# Patient Record
Sex: Female | Born: 1940 | Race: White | Hispanic: No | Marital: Single | Smoking: Former smoker
Health system: Southern US, Community
[De-identification: ages and names within clinical notes are randomized; demographics above are authoritative.]

## PROBLEM LIST (undated history)

## (undated) DIAGNOSIS — K219 Gastro-esophageal reflux disease without esophagitis: Secondary | ICD-10-CM

## (undated) DIAGNOSIS — Z860101 Personal history of adenomatous and serrated colon polyps: Secondary | ICD-10-CM

## (undated) DIAGNOSIS — Z86010 Personal history of colonic polyps: Secondary | ICD-10-CM

## (undated) DIAGNOSIS — G4730 Sleep apnea, unspecified: Secondary | ICD-10-CM

## (undated) DIAGNOSIS — Z8739 Personal history of other diseases of the musculoskeletal system and connective tissue: Secondary | ICD-10-CM

## (undated) DIAGNOSIS — Z923 Personal history of irradiation: Secondary | ICD-10-CM

## (undated) DIAGNOSIS — R040 Epistaxis: Secondary | ICD-10-CM

## (undated) DIAGNOSIS — K5909 Other constipation: Secondary | ICD-10-CM

## (undated) DIAGNOSIS — R011 Cardiac murmur, unspecified: Secondary | ICD-10-CM

## (undated) DIAGNOSIS — E119 Type 2 diabetes mellitus without complications: Secondary | ICD-10-CM

## (undated) DIAGNOSIS — E785 Hyperlipidemia, unspecified: Secondary | ICD-10-CM

## (undated) DIAGNOSIS — C50919 Malignant neoplasm of unspecified site of unspecified female breast: Secondary | ICD-10-CM

## (undated) DIAGNOSIS — I10 Essential (primary) hypertension: Secondary | ICD-10-CM

## (undated) DIAGNOSIS — C541 Malignant neoplasm of endometrium: Secondary | ICD-10-CM

## (undated) DIAGNOSIS — E790 Hyperuricemia without signs of inflammatory arthritis and tophaceous disease: Secondary | ICD-10-CM

## (undated) DIAGNOSIS — Q309 Congenital malformation of nose, unspecified: Secondary | ICD-10-CM

## (undated) DIAGNOSIS — Z87442 Personal history of urinary calculi: Secondary | ICD-10-CM

## (undated) DIAGNOSIS — K5790 Diverticulosis of intestine, part unspecified, without perforation or abscess without bleeding: Secondary | ICD-10-CM

## (undated) DIAGNOSIS — R0600 Dyspnea, unspecified: Secondary | ICD-10-CM

## (undated) DIAGNOSIS — M1990 Unspecified osteoarthritis, unspecified site: Secondary | ICD-10-CM

## (undated) DIAGNOSIS — I4891 Unspecified atrial fibrillation: Secondary | ICD-10-CM

## (undated) DIAGNOSIS — D4819 Other specified neoplasm of uncertain behavior of connective and other soft tissue: Secondary | ICD-10-CM

## (undated) DIAGNOSIS — D481 Neoplasm of uncertain behavior of connective and other soft tissue: Secondary | ICD-10-CM

## (undated) DIAGNOSIS — I499 Cardiac arrhythmia, unspecified: Secondary | ICD-10-CM

## (undated) HISTORY — DX: Unspecified osteoarthritis, unspecified site: M19.90

## (undated) HISTORY — DX: Essential (primary) hypertension: I10

## (undated) HISTORY — PX: TONSILLECTOMY: SUR1361

## (undated) HISTORY — PX: COLONOSCOPY: SHX174

## (undated) HISTORY — PX: OOPHORECTOMY: SHX86

## (undated) HISTORY — DX: Other specified neoplasm of uncertain behavior of connective and other soft tissue: D48.19

## (undated) HISTORY — DX: Gastro-esophageal reflux disease without esophagitis: K21.9

## (undated) HISTORY — PX: ABDOMINAL HYSTERECTOMY: SHX81

## (undated) HISTORY — DX: Hyperlipidemia, unspecified: E78.5

## (undated) HISTORY — DX: Neoplasm of uncertain behavior of connective and other soft tissue: D48.1

## (undated) HISTORY — DX: Sleep apnea, unspecified: G47.30

## (undated) HISTORY — PX: APPENDECTOMY: SHX54

---

## 1986-02-14 HISTORY — PX: THORACOSCOPY: SUR1347

## 1999-03-17 ENCOUNTER — Other Ambulatory Visit: Admission: RE | Admit: 1999-03-17 | Discharge: 1999-03-17 | Payer: Self-pay | Admitting: Obstetrics and Gynecology

## 2000-02-15 DIAGNOSIS — Z923 Personal history of irradiation: Secondary | ICD-10-CM

## 2000-02-15 DIAGNOSIS — C50919 Malignant neoplasm of unspecified site of unspecified female breast: Secondary | ICD-10-CM

## 2000-02-15 HISTORY — PX: BREAST BIOPSY: SHX20

## 2000-02-15 HISTORY — PX: BREAST LUMPECTOMY: SHX2

## 2000-02-15 HISTORY — DX: Malignant neoplasm of unspecified site of unspecified female breast: C50.919

## 2000-02-15 HISTORY — DX: Personal history of irradiation: Z92.3

## 2003-12-01 ENCOUNTER — Ambulatory Visit: Payer: Self-pay | Admitting: General Surgery

## 2003-12-04 ENCOUNTER — Ambulatory Visit: Payer: Self-pay | Admitting: Oncology

## 2004-01-29 ENCOUNTER — Ambulatory Visit: Payer: Self-pay | Admitting: Radiation Oncology

## 2004-06-03 ENCOUNTER — Ambulatory Visit: Payer: Self-pay | Admitting: Oncology

## 2004-11-30 ENCOUNTER — Ambulatory Visit: Payer: Self-pay | Admitting: Oncology

## 2004-12-03 ENCOUNTER — Ambulatory Visit: Payer: Self-pay | Admitting: General Surgery

## 2004-12-15 ENCOUNTER — Ambulatory Visit: Payer: Self-pay | Admitting: Oncology

## 2005-01-27 ENCOUNTER — Ambulatory Visit: Payer: Self-pay | Admitting: Radiation Oncology

## 2005-06-01 ENCOUNTER — Ambulatory Visit: Payer: Self-pay | Admitting: Oncology

## 2005-08-12 ENCOUNTER — Ambulatory Visit: Payer: Self-pay | Admitting: Gastroenterology

## 2005-11-18 ENCOUNTER — Ambulatory Visit: Payer: Self-pay | Admitting: Gastroenterology

## 2005-11-28 ENCOUNTER — Ambulatory Visit: Payer: Self-pay | Admitting: Oncology

## 2005-12-05 ENCOUNTER — Ambulatory Visit: Payer: Self-pay | Admitting: General Surgery

## 2005-12-15 ENCOUNTER — Ambulatory Visit: Payer: Self-pay | Admitting: Oncology

## 2006-01-25 ENCOUNTER — Ambulatory Visit: Payer: Self-pay | Admitting: Oncology

## 2006-05-16 ENCOUNTER — Ambulatory Visit: Payer: Self-pay | Admitting: Oncology

## 2006-06-05 ENCOUNTER — Ambulatory Visit: Payer: Self-pay | Admitting: Oncology

## 2006-06-15 ENCOUNTER — Ambulatory Visit: Payer: Self-pay | Admitting: Oncology

## 2006-12-08 ENCOUNTER — Ambulatory Visit: Payer: Self-pay | Admitting: Oncology

## 2007-01-15 ENCOUNTER — Ambulatory Visit: Payer: Self-pay | Admitting: Oncology

## 2007-01-25 ENCOUNTER — Ambulatory Visit: Payer: Self-pay | Admitting: Oncology

## 2007-02-15 ENCOUNTER — Ambulatory Visit: Payer: Self-pay | Admitting: Oncology

## 2007-03-01 ENCOUNTER — Ambulatory Visit: Payer: Self-pay | Admitting: Internal Medicine

## 2007-08-15 ENCOUNTER — Ambulatory Visit: Payer: Self-pay | Admitting: Oncology

## 2007-12-10 ENCOUNTER — Ambulatory Visit: Payer: Self-pay | Admitting: Oncology

## 2008-01-15 ENCOUNTER — Ambulatory Visit: Payer: Self-pay | Admitting: Oncology

## 2008-01-24 ENCOUNTER — Ambulatory Visit: Payer: Self-pay | Admitting: Oncology

## 2008-02-15 ENCOUNTER — Ambulatory Visit: Payer: Self-pay | Admitting: Oncology

## 2008-03-26 ENCOUNTER — Ambulatory Visit: Payer: Self-pay | Admitting: Internal Medicine

## 2008-04-24 ENCOUNTER — Ambulatory Visit: Payer: Self-pay | Admitting: Internal Medicine

## 2008-12-11 ENCOUNTER — Ambulatory Visit: Payer: Self-pay | Admitting: Oncology

## 2009-01-14 ENCOUNTER — Ambulatory Visit: Payer: Self-pay | Admitting: Oncology

## 2009-01-15 ENCOUNTER — Ambulatory Visit: Payer: Self-pay | Admitting: Oncology

## 2009-02-14 ENCOUNTER — Ambulatory Visit: Payer: Self-pay | Admitting: Oncology

## 2009-04-20 ENCOUNTER — Ambulatory Visit: Payer: Self-pay | Admitting: Unknown Physician Specialty

## 2009-12-14 ENCOUNTER — Ambulatory Visit: Payer: Self-pay | Admitting: Oncology

## 2010-01-21 ENCOUNTER — Ambulatory Visit: Payer: Self-pay | Admitting: Oncology

## 2010-02-14 ENCOUNTER — Ambulatory Visit: Payer: Self-pay | Admitting: Oncology

## 2010-12-20 ENCOUNTER — Ambulatory Visit: Payer: Self-pay | Admitting: Oncology

## 2011-01-24 ENCOUNTER — Ambulatory Visit: Payer: Self-pay | Admitting: Oncology

## 2011-02-15 ENCOUNTER — Ambulatory Visit: Payer: Self-pay | Admitting: Oncology

## 2011-02-15 DIAGNOSIS — C541 Malignant neoplasm of endometrium: Secondary | ICD-10-CM

## 2011-02-15 HISTORY — DX: Malignant neoplasm of endometrium: C54.1

## 2011-05-06 ENCOUNTER — Ambulatory Visit: Payer: Self-pay | Admitting: Internal Medicine

## 2011-06-23 ENCOUNTER — Ambulatory Visit: Payer: Self-pay | Admitting: Oncology

## 2011-07-16 ENCOUNTER — Ambulatory Visit: Payer: Self-pay | Admitting: Oncology

## 2011-07-26 ENCOUNTER — Ambulatory Visit: Payer: Self-pay | Admitting: Obstetrics & Gynecology

## 2011-07-26 LAB — CBC
HCT: 41.6 % (ref 35.0–47.0)
MCH: 29.4 pg (ref 26.0–34.0)
MCV: 90 fL (ref 80–100)
Platelet: 171 10*3/uL (ref 150–440)
RBC: 4.61 10*6/uL (ref 3.80–5.20)
RDW: 15.9 % — ABNORMAL HIGH (ref 11.5–14.5)
WBC: 8.9 10*3/uL (ref 3.6–11.0)

## 2011-07-26 LAB — BASIC METABOLIC PANEL
Anion Gap: 7 (ref 7–16)
BUN: 16 mg/dL (ref 7–18)
Calcium, Total: 9.3 mg/dL (ref 8.5–10.1)
Chloride: 105 mmol/L (ref 98–107)
Co2: 28 mmol/L (ref 21–32)
Creatinine: 0.6 mg/dL (ref 0.60–1.30)
EGFR (African American): >60
Glucose: 85 mg/dL (ref 65–99)
Potassium: 3.7 mmol/L (ref 3.5–5.1)

## 2011-07-26 LAB — APTT: Activated PTT: 39.1 secs — ABNORMAL HIGH (ref 23.6–35.9)

## 2011-07-26 LAB — PROTIME-INR: INR: 1.3

## 2011-08-02 ENCOUNTER — Inpatient Hospital Stay: Payer: Self-pay | Admitting: Obstetrics & Gynecology

## 2011-08-02 DIAGNOSIS — Z01810 Encounter for preprocedural cardiovascular examination: Secondary | ICD-10-CM

## 2011-08-02 LAB — PROTIME-INR
INR: 1
Prothrombin Time: 13.9 secs (ref 11.5–14.7)

## 2011-08-15 ENCOUNTER — Ambulatory Visit: Payer: Self-pay | Admitting: Oncology

## 2011-08-30 ENCOUNTER — Ambulatory Visit: Payer: Self-pay | Admitting: Gynecologic Oncology

## 2011-09-15 ENCOUNTER — Ambulatory Visit: Payer: Self-pay | Admitting: Gynecologic Oncology

## 2011-09-15 ENCOUNTER — Ambulatory Visit: Payer: Self-pay | Admitting: Oncology

## 2011-10-16 ENCOUNTER — Ambulatory Visit: Payer: Self-pay | Admitting: Gynecologic Oncology

## 2011-10-16 ENCOUNTER — Ambulatory Visit: Payer: Self-pay | Admitting: Oncology

## 2011-12-21 ENCOUNTER — Ambulatory Visit: Payer: Self-pay | Admitting: Oncology

## 2012-02-02 ENCOUNTER — Ambulatory Visit: Payer: Self-pay | Admitting: Oncology

## 2012-02-15 ENCOUNTER — Ambulatory Visit: Payer: Self-pay | Admitting: Oncology

## 2012-05-25 ENCOUNTER — Ambulatory Visit: Payer: Self-pay | Admitting: Cardiology

## 2012-06-29 ENCOUNTER — Ambulatory Visit: Payer: Self-pay | Admitting: Cardiology

## 2012-12-21 ENCOUNTER — Ambulatory Visit: Payer: Self-pay

## 2013-05-16 DIAGNOSIS — E785 Hyperlipidemia, unspecified: Secondary | ICD-10-CM | POA: Insufficient documentation

## 2013-05-16 DIAGNOSIS — G4733 Obstructive sleep apnea (adult) (pediatric): Secondary | ICD-10-CM | POA: Insufficient documentation

## 2013-05-16 DIAGNOSIS — Z9989 Dependence on other enabling machines and devices: Secondary | ICD-10-CM

## 2013-05-16 DIAGNOSIS — I10 Essential (primary) hypertension: Secondary | ICD-10-CM | POA: Insufficient documentation

## 2013-07-18 DIAGNOSIS — I4891 Unspecified atrial fibrillation: Secondary | ICD-10-CM | POA: Insufficient documentation

## 2013-12-25 ENCOUNTER — Ambulatory Visit: Payer: Self-pay

## 2014-04-11 DIAGNOSIS — K5909 Other constipation: Secondary | ICD-10-CM | POA: Insufficient documentation

## 2014-04-11 DIAGNOSIS — Z86010 Personal history of colonic polyps: Secondary | ICD-10-CM | POA: Insufficient documentation

## 2014-05-15 ENCOUNTER — Ambulatory Visit
Admit: 2014-05-15 | Disposition: A | Discharge status: Home / Self Care | Payer: Self-pay | Attending: Unknown Physician Specialty | Admitting: Unknown Physician Specialty

## 2014-06-08 NOTE — Op Note (Signed)
PATIENT NAME:  Brooke Krueger, Brooke Krueger MR#:  [id number] DATE OF BIRTH:  November 20, 1940  DATE OF PROCEDURE:  08/02/2011  PREOPERATIVE DIAGNOSIS: Endometrial carcinoma.   POSTOPERATIVE DIAGNOSIS: Endometrial carcinoma.  PROCEDURE: Total abdominal hysterectomy, bilateral salpingo-oophorectomy, and lymph node sampling.   SURGEON: Glean Salen, MD  CO-SURGEON: Jacquelyne Balint, MD   ANESTHESIA: General.   ESTIMATED BLOOD LOSS: 250 mL.   COMPLICATIONS: None.   FINDINGS: Endometrial carcinoma, confined to uterus with less than 50% invasion into the myometrium.   DISPOSITION: To recovery room in stable condition.   TECHNIQUE: The patient is prepped and draped in the usual sterile fashion after adequate anesthesia is obtained in the supine position on the operating room table with a Foley catheter in place.   A midline vertical incision is made from just inferior to the umbilicus to just superior to the pubic symphysis. Dissection is carried down to the level of the rectus fascia which is then dissected superiorly and inferiorly with separation of the rectus muscles in the midline. The peritoneum is penetrated and dissected. Pelvic washings are obtained. A Bookwalter retractor is placed. The uterus is grasped at the cornea. The round ligaments are suture ligated and then cut. Anterior and posterior leaves of the broad ligaments are carefully dissected. The infundibulopelvic blood vessels and ligaments are carefully clamped, transected, and suture ligated for complete amputation of the adnexa with the uterine specimen. Dissection is carried down to the level of the uterine arteries which are also clamped, transected, and suture ligated with excellent hemostasis noted. Dissection to the level of the external os is reached and then the uterus with cervix is completely amputated and sent to pathology for frozen section analysis.   The vaginal cuff is closed with a 0 Vicryl suture. The pelvic cavity is irrigated.    Pelvic lymph node sampling is performed by Dr. Jacquelyne Balint with a separate dictation noted.   After excellent hemostasis is assured, in the pelvic cavity as well as along the lymph node dissection sites, the patient is leveled and retractor is removed. The rectus fascia is closed with a #1 PDS suture in a locking fashion. The subcutaneous tissues are irrigated and hemostasis is assured using electrocautery. A subcutaneous layer of plain gut suture is placed followed by surgical clips for skin closure. Bandage is applied. The patient goes to the recovery room in stable condition. All sponge, instrument, and needle counts are correct. ____________________________ R. Barnett Applebaum, MD rph:slb D: 08/02/2011 13:46:03 ET T: 08/02/2011 14:17:36 ET JOB#: [id number]  cc: Glean Salen, MD, <Dictator> Gae Dry MD ELECTRONICALLY SIGNED 08/02/2011 14:35

## 2014-06-08 NOTE — Op Note (Signed)
PATIENT NAME:  [name], [name] MR#:  [id number] DATE OF BIRTH:  1940/05/02  DATE OF PROCEDURE:  08/02/2011  PREOPERATIVE DIAGNOSIS: Adenocarcinoma of the endometrium.   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   PROCEDURES PERFORMED: Exploratory laparotomy, total abdominal hysterectomy with bilateral salpingo-oophorectomy, and pelvic node sampling.   CO-SURGEONS: Weber Cooks, M.D. and Dr. Barnett Applebaum  ANESTHESIA: General.   ESTIMATED BLOOD LOSS: 300 mL.   COMPLICATIONS: None.   INDICATION FOR SURGERY: [name] is a 74 year old patient who complained of postmenopausal bleeding and on endometrial biopsy was noted to have a well-differentiated endometrioid adenocarcinoma. Therefore decision was made to proceed with surgery.   FINDINGS AT TIME OF SURGERY: Uterus small. Both adnexa normal. No evidence of extrauterine disease on pelvic and abdominal inspection and palpation.   Procedure difficult due to truncal obesity.   Frozen section fails to reveal any evidence of deep invasion.   OPERATIVE REPORT: After adequate general anesthesia had been obtained, the patient was prepped and draped in supine position. A midline incision was placed with a sharp knife and carried down through the fascia. The peritoneum was identified and entered. Incision was extended cephalad and caudad. Exploration was done with the above-mentioned findings. Pelvic cytology was collected.   The hysterectomy is dictated by Dr. Kenton Kingfisher.   Using the Harmonic scalpel, the node bearing tissue around the external iliac vessels and from the hypogastric vessels was removed. Hemostasis was noted to be adequate. This was done in similar fashion on either pelvic sidewall.   Then irrigation was performed and persistent bleeding was noted in the right parametrial area as well as in the rectovaginal space. The area was cauterized and several figure of eight stitches were placed until  adequate hemostasis was confirmed. FloSeal was then placed in the area of dissection.   The remainder of the procedure is dictated by Dr. Kenton Kingfisher.  ____________________________ Weber Cooks, MD bem:cms D: 08/02/2011 14:43:32 ET T: 08/02/2011 14:51:34 ET JOB#: [id number]  cc: Weber Cooks, MD, <Dictator> Weber Cooks MD ELECTRONICALLY SIGNED 08/09/2011 8:34

## 2014-06-09 LAB — SURGICAL PATHOLOGY

## 2014-06-20 DIAGNOSIS — R739 Hyperglycemia, unspecified: Secondary | ICD-10-CM | POA: Insufficient documentation

## 2014-06-20 DIAGNOSIS — E559 Vitamin D deficiency, unspecified: Secondary | ICD-10-CM | POA: Insufficient documentation

## 2014-12-23 ENCOUNTER — Other Ambulatory Visit: Payer: Self-pay | Admitting: Infectious Diseases

## 2014-12-23 DIAGNOSIS — Z1231 Encounter for screening mammogram for malignant neoplasm of breast: Secondary | ICD-10-CM

## 2014-12-31 ENCOUNTER — Ambulatory Visit
Admission: RE | Admit: 2014-12-31 | Discharge: 2014-12-31 | Disposition: A | Discharge status: Home / Self Care | Payer: Medicare Other | Source: Ambulatory Visit | Attending: Infectious Diseases | Admitting: Infectious Diseases

## 2014-12-31 ENCOUNTER — Other Ambulatory Visit: Payer: Self-pay | Admitting: Infectious Diseases

## 2014-12-31 DIAGNOSIS — Z1231 Encounter for screening mammogram for malignant neoplasm of breast: Secondary | ICD-10-CM | POA: Insufficient documentation

## 2014-12-31 DIAGNOSIS — Z853 Personal history of malignant neoplasm of breast: Secondary | ICD-10-CM | POA: Insufficient documentation

## 2014-12-31 HISTORY — DX: Malignant neoplasm of unspecified site of unspecified female breast: C50.919

## 2014-12-31 HISTORY — DX: Malignant neoplasm of endometrium: C54.1

## 2015-12-07 ENCOUNTER — Ambulatory Visit: Payer: Self-pay | Admitting: Urology

## 2015-12-16 ENCOUNTER — Ambulatory Visit (INDEPENDENT_AMBULATORY_CARE_PROVIDER_SITE_OTHER): Payer: Medicare Other | Admitting: Urology

## 2015-12-16 ENCOUNTER — Encounter: Payer: Self-pay | Admitting: Urology

## 2015-12-16 VITALS — BP 137/81 | HR 121 | Ht 62.0 in | Wt 195.0 lb

## 2015-12-16 DIAGNOSIS — N390 Urinary tract infection, site not specified: Secondary | ICD-10-CM

## 2015-12-16 DIAGNOSIS — Z87442 Personal history of urinary calculi: Secondary | ICD-10-CM | POA: Diagnosis not present

## 2015-12-16 LAB — BLADDER SCAN AMB NON-IMAGING

## 2015-12-16 NOTE — Progress Notes (Signed)
12/16/2015 5:15 PM   Brooke Krueger [facility] 10/16/1940 CQ:[id number]  Referring provider: Leonel Ramsay, MD [place], [place] [address]  Chief Complaint  Patient presents with  . Recurrent UTI    New Patient    HPI: 75 yo F with recurrent UTIs who presents today for further evaluation.  She has had about 2 infections annually for the past two years but not prior to this.  Review of urine culture data show Escherichia coli urinary tract infections in October and July 2017 as well as June and October 2016.  When she has an infection, she has urinary urgency and frequency without dysuria but some post voiding urethral discomfort.  No fevers with infections or admission.    She does have baseline urinary urgency and has been taking oxybutinin 15 mg XL daily for the past 3 years.  She reports that she often waits too long to get to the bathroom even though she knows her bladder is full. This is what her symptoms bother her most. If she doesn't wait, she doesn't generally have issues.  She does avoid caffeine.  She drinks decaf coffee in the AM and diet soda.  She does not drink much water.  She denies SUI.    She uses Assurance wipes for the past two years.  She notes a correlation between starting using these wipes for both bowel movements and urination and at the onset of her infections.  Remote history of kidney stones, passed a stone spontaneously partially 40 years ago, none since. She denies flank pain or gross hematuria.   History of breast and uterine cancer s/p lumpectomy/ XRT 2002 and hysterectomy 2013.      Post void residual today was minimal.  PMH: Past Medical History:  Diagnosis Date  . Abdominal fibromatosis   . Arthritis   . Breast cancer ([place]) 2002   right lumpectomy  . Endometrial cancer ([place]) 2013  . GERD (gastroesophageal reflux disease)   . Hyperlipemia   . Hypertension   . Sleep apnea      Surgical History: Past Surgical History:  Procedure Laterality Date  . ABDOMINAL HYSTERECTOMY    . BREAST LUMPECTOMY Right 2002    Home Medications:    Medication List       Accurate as of 12/16/15  5:15 PM. Always use your most recent med list.          atorvastatin 20 MG tablet Commonly known as:  LIPITOR   calcium carbonate 1250 (500 Ca) MG tablet Commonly known as:  OS-CAL - dosed in mg of elemental calcium Take 1 tablet by mouth.   Cranberry 200 MG Caps Take by mouth.   [employer] D3 2000 units Caps Generic drug:  Cholecalciferol TAKE 1 CAPSULE (2,000 UNITS TOTAL) BY MOUTH ONCE DAILY.   hydrochlorothiazide 25 MG tablet Commonly known as:  HYDRODIURIL   LUMIGAN 0.01 % Soln Generic drug:  bimatoprost   metoprolol succinate 50 MG 24 hr tablet Commonly known as:  TOPROL-XL   oxybutynin 15 MG 24 hr tablet Commonly known as:  DITROPAN XL   XARELTO 20 MG Tabs tablet Generic drug:  rivaroxaban       Allergies:  Allergies  Allergen Reactions  . Aspirin Other (See Comments)    Closes throat  . Penicillins Rash    Family History: Family History  Problem Relation Age of Onset  . Breast cancer Mother 75  . Breast cancer Sister 26  .  Bladder Cancer Neg Hx   . Prostate cancer Neg Hx   . Kidney cancer Neg Hx     Social History:  reports that she has quit smoking. She has never used smokeless tobacco. She reports that she drinks alcohol. She reports that she does not use drugs.  ROS: UROLOGY Frequent Urination?: No Hard to postpone urination?: No Burning/pain with urination?: No Get up at night to urinate?: Yes Leakage of urine?: Yes Urine stream starts and stops?: No Trouble starting stream?: No Do you have to strain to urinate?: No Blood in urine?: No Urinary tract infection?: Yes Sexually transmitted disease?: No Injury to kidneys or bladder?: No Painful intercourse?: No Weak stream?: No Currently pregnant?: No Vaginal bleeding?: No Last  menstrual period?: n  Gastrointestinal Nausea?: No Vomiting?: No Indigestion/heartburn?: No Diarrhea?: No Constipation?: No  Constitutional Fever: No Night sweats?: No Weight loss?: No Fatigue?: No  Skin Skin rash/lesions?: No Itching?: No  Eyes Blurred vision?: No Double vision?: No  Ears/Nose/Throat Sore throat?: No Sinus problems?: No  Hematologic/Lymphatic Swollen glands?: No Easy bruising?: No  Cardiovascular Leg swelling?: No Chest pain?: No  Respiratory Cough?: No Shortness of breath?: No  Endocrine Excessive thirst?: No  Musculoskeletal Back pain?: No Joint pain?: No  Neurological Headaches?: No Dizziness?: No  Psychologic Depression?: No Anxiety?: No  Physical Exam: BP 137/81   Pulse (!) 121   Ht 5\' 2"  (1.575 m)   Wt 195 lb (88.5 kg)   BMI 35.67 kg/m   Constitutional:  Alert and oriented, No acute distress. HEENT: [place] AT, moist mucus membranes.  Trachea midline, no masses. Cardiovascular: No clubbing, cyanosis, or edema. Respiratory: Normal respiratory effort, no increased work of breathing. GI: Abdomen is soft, nontender, nondistended, no abdominal masses.  Obese. GU: No CVA tenderness Skin: No rashes, bruises or suspicious lesions. Neurologic: Grossly intact, no focal deficits, moving all 4 extremities. Psychiatric: Normal mood and affect.  Laboratory Data: Lab Results  Component Value Date   WBC 8.9 07/26/2011   HGB 12.3 08/03/2011   HCT 41.6 07/26/2011   MCV 90 07/26/2011   PLT 171 07/26/2011    Lab Results  Component Value Date   CREATININE 0.60 07/26/2011    Pertinent Imaging: Results for orders placed or performed in visit on 12/16/15  BLADDER SCAN AMB NON-IMAGING  Result Value Ref Range   Scan Result 45ml      Assessment & Plan:   1. 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    Ideally, we would be able to start her on a topical estrogen cream, although given her  Given only 2 infections a year, will defer renal imaging and cystoscopy at this time although may consider if frequency of infections increase.  If he started to see an increasing pattern of infections, will pursue this.  I have advised the patient return to our office if she has any signs or symptoms of infection. She may benefit from a catheterized specimen given her habitus.  - BLADDER SCAN AMB NON-IMAGING  2. History of kidney stones Offered KUB today to assess for nonobstructing calculi, patient declined.   Return if symptoms worsen or fail to improve.  Hollice Espy, MD  [facility] [address] [place], [place] [address] [phone]

## 2015-12-16 NOTE — Patient Instructions (Signed)
1. Probiotic vs. Yogurt daily  2. Cranberry tabs twice daily  3. Get rid of wipes  4. Drink more water  5. Sorry, no estogen for you!

## 2015-12-31 ENCOUNTER — Other Ambulatory Visit: Payer: Self-pay | Admitting: Infectious Diseases

## 2015-12-31 DIAGNOSIS — Z1231 Encounter for screening mammogram for malignant neoplasm of breast: Secondary | ICD-10-CM

## 2016-01-04 ENCOUNTER — Telehealth: Payer: Self-pay

## 2016-01-04 ENCOUNTER — Ambulatory Visit (INDEPENDENT_AMBULATORY_CARE_PROVIDER_SITE_OTHER): Payer: Medicare Other

## 2016-01-04 VITALS — BP 137/84 | HR 103 | Ht 62.0 in | Wt 247.1 lb

## 2016-01-04 DIAGNOSIS — R300 Dysuria: Secondary | ICD-10-CM | POA: Diagnosis not present

## 2016-01-04 DIAGNOSIS — N390 Urinary tract infection, site not specified: Secondary | ICD-10-CM

## 2016-01-04 LAB — URINALYSIS, COMPLETE
BILIRUBIN UA: NEGATIVE
GLUCOSE, UA: NEGATIVE
KETONES UA: NEGATIVE
Nitrite, UA: POSITIVE — AB
SPEC GRAV UA: 1.015 (ref 1.005–1.030)
UUROB: 0.2 mg/dL (ref 0.2–1.0)
pH, UA: 6 (ref 5.0–7.5)

## 2016-01-04 LAB — MICROSCOPIC EXAMINATION: Epithelial Cells (non renal): NONE SEEN /hpf (ref 0–10)

## 2016-01-04 MED ORDER — SULFAMETHOXAZOLE-TRIMETHOPRIM 800-160 MG PO TABS: 1.0000 | ORAL_TABLET | Freq: Two times a day (BID) | ORAL | 0 refills | Status: AC

## 2016-01-04 NOTE — Telephone Encounter (Signed)
Read message to pt that rx was called into pharmacy

## 2016-01-04 NOTE — Telephone Encounter (Signed)
-----   Message from Hollice Espy, MD sent at 01/04/2016  1:10 PM EST ----- Please go ahead and treat with Bactrim DS bid x 7 days.  We will adjust as needed.  Hollice Espy, MD

## 2016-01-04 NOTE — Progress Notes (Signed)
Pt presented today with c/o urinary frequency and urgency, hard to postpone urination, dysuria, and back pain. A cath specimen was obtained for u/a and cx.   Blood pressure 137/84, pulse (!) 103, height 5\' 2"  (1.575 m), weight 247 lb 1.6 oz (112.1 kg).

## 2016-01-04 NOTE — Telephone Encounter (Signed)
LMOM-medication sent to pharmacy 

## 2016-01-06 LAB — CULTURE, URINE COMPREHENSIVE

## 2016-01-11 ENCOUNTER — Telehealth: Payer: Self-pay

## 2016-01-11 NOTE — Telephone Encounter (Signed)
Spoke with pt in reference to dysuria post abx. Made pt aware of cath specimen and AZO. Pt elected to try AZO for a couple of days and then will call back if she is not any better.

## 2016-01-11 NOTE — Telephone Encounter (Signed)
She has been on the correct abx.  Please have her come back in for catheterized repeat UA/ UCx.  In the interim, use pyridium as needed for dysuria.    Hollice Espy, MD

## 2016-01-11 NOTE — Telephone Encounter (Signed)
Pt called stating today is the last day for her abx, bactrim. Pt stated that she is continuing to have dysuria and back pain. Please advise.

## 2016-01-14 ENCOUNTER — Ambulatory Visit (INDEPENDENT_AMBULATORY_CARE_PROVIDER_SITE_OTHER): Payer: Medicare Other

## 2016-01-14 VITALS — BP 141/84 | HR 102 | Ht 63.0 in | Wt 247.9 lb

## 2016-01-14 DIAGNOSIS — N390 Urinary tract infection, site not specified: Secondary | ICD-10-CM

## 2016-01-14 LAB — URINALYSIS, COMPLETE
Bilirubin, UA: NEGATIVE
GLUCOSE, UA: NEGATIVE
KETONES UA: NEGATIVE
LEUKOCYTES UA: NEGATIVE
Nitrite, UA: POSITIVE — AB
PROTEIN UA: NEGATIVE
SPEC GRAV UA: 1.02 (ref 1.005–1.030)
Urobilinogen, Ur: 0.2 mg/dL (ref 0.2–1.0)
pH, UA: 5.5 (ref 5.0–7.5)

## 2016-01-14 LAB — MICROSCOPIC EXAMINATION: Epithelial Cells (non renal): >10 /hpf — AB (ref 0–10)

## 2016-01-14 NOTE — Progress Notes (Signed)
Pt presented today with c/o urinary frequency, hard to postpone urination and dysuria. Pt has been on abx in the past 30 days for a UTI. A cath specimen was obtained for a u/a and cx.   Blood pressure (!) 141/84, pulse (!) 102, height 5\' 3"  (1.6 m), weight 247 lb 14.4 oz (112.4 kg).

## 2016-01-17 LAB — CULTURE, URINE COMPREHENSIVE

## 2016-01-18 ENCOUNTER — Telehealth: Payer: Self-pay

## 2016-01-18 NOTE — Telephone Encounter (Signed)
Spoke with pt in reference to -ucx. Pt voiced understanding.  

## 2016-01-18 NOTE — Telephone Encounter (Signed)
Line busy

## 2016-01-18 NOTE — Telephone Encounter (Signed)
-----   Message from Hollice Espy, MD sent at 01/14/2016  2:33 PM EST ----- UA looks good, please let her know.  Nitirite + from pyridium.  Can you please confirm that this was a catheterized specimen given large epithelial's?

## 2016-02-11 ENCOUNTER — Ambulatory Visit
Admission: RE | Admit: 2016-02-11 | Discharge: 2016-02-11 | Disposition: A | Discharge status: Home / Self Care | Payer: Medicare Other | Source: Ambulatory Visit | Attending: Infectious Diseases | Admitting: Infectious Diseases

## 2016-02-11 DIAGNOSIS — Z1231 Encounter for screening mammogram for malignant neoplasm of breast: Secondary | ICD-10-CM | POA: Diagnosis not present

## 2016-04-19 ENCOUNTER — Ambulatory Visit (INDEPENDENT_AMBULATORY_CARE_PROVIDER_SITE_OTHER): Payer: Medicare Other

## 2016-04-19 ENCOUNTER — Telehealth: Payer: Self-pay

## 2016-04-19 VITALS — BP 138/85 | HR 114 | Ht 62.0 in | Wt 247.6 lb

## 2016-04-19 DIAGNOSIS — N390 Urinary tract infection, site not specified: Secondary | ICD-10-CM | POA: Diagnosis not present

## 2016-04-19 LAB — URINALYSIS, COMPLETE
BILIRUBIN UA: NEGATIVE
Glucose, UA: NEGATIVE
KETONES UA: NEGATIVE
Leukocytes, UA: NEGATIVE
NITRITE UA: NEGATIVE
PH UA: 5.5 (ref 5.0–7.5)
Protein, UA: NEGATIVE
SPEC GRAV UA: 1.02 (ref 1.005–1.030)
Urobilinogen, Ur: 0.2 mg/dL (ref 0.2–1.0)

## 2016-04-19 LAB — MICROSCOPIC EXAMINATION

## 2016-04-19 NOTE — Telephone Encounter (Signed)
-----   Message from Hollice Espy, MD sent at 04/19/2016  1:59 PM EST ----- UA looks fairly decent. We'll wait for culture but I do not suspect infection.  Hollice Espy, MD

## 2016-04-19 NOTE — Progress Notes (Signed)
Pt presents today with c/o urinary frequency and urgency, hard to postpone urination, and back pain. A CATH specimen was obtained for u/a and cx.  Blood pressure 138/85, pulse (!) 114, height 5\' 2"  (1.575 m), weight 247 lb 9.6 oz (112.3 kg).

## 2016-04-19 NOTE — Telephone Encounter (Signed)
Spoke with pt in reference to u/a results and ucx. Pt voiced understanding.

## 2016-04-21 LAB — CULTURE, URINE COMPREHENSIVE

## 2016-04-22 ENCOUNTER — Telehealth: Payer: Self-pay

## 2016-04-22 DIAGNOSIS — N390 Urinary tract infection, site not specified: Secondary | ICD-10-CM

## 2016-04-22 MED ORDER — SULFAMETHOXAZOLE-TRIMETHOPRIM 800-160 MG PO TABS: 1.0000 | ORAL_TABLET | Freq: Two times a day (BID) | ORAL | 0 refills | Status: AC

## 2016-04-22 NOTE — Telephone Encounter (Signed)
Spoke with pt in reference to +ucx. Made aware abx were sent to pharmacy. Pt voiced understanding.  

## 2016-04-22 NOTE — Telephone Encounter (Signed)
-----   Message from Hollice Espy, MD sent at 04/22/2016 11:52 AM EST ----- She actually did grow E. Coli.  Please treat with Bactrim DS bid x 7 days.    Hollice Espy, MD

## 2016-09-26 ENCOUNTER — Ambulatory Visit (INDEPENDENT_AMBULATORY_CARE_PROVIDER_SITE_OTHER): Payer: Medicare Other

## 2016-09-26 VITALS — BP 132/84 | HR 114 | Ht 62.0 in | Wt 242.5 lb

## 2016-09-26 DIAGNOSIS — N390 Urinary tract infection, site not specified: Secondary | ICD-10-CM | POA: Diagnosis not present

## 2016-09-26 LAB — URINALYSIS, COMPLETE
Bilirubin, UA: NEGATIVE
Glucose, UA: NEGATIVE
KETONES UA: NEGATIVE
NITRITE UA: NEGATIVE
PH UA: 7 (ref 5.0–7.5)
Protein, UA: NEGATIVE
Specific Gravity, UA: 1.015 (ref 1.005–1.030)
Urobilinogen, Ur: 0.2 mg/dL (ref 0.2–1.0)

## 2016-09-26 LAB — MICROSCOPIC EXAMINATION: RBC MICROSCOPIC, UA: NONE SEEN /HPF (ref 0–?)

## 2016-09-26 NOTE — Progress Notes (Signed)
Pt presented today with c/o dysuria and back pain. A CATH specimen was obtained for u/a and cx.   Blood pressure 132/84, pulse (!) 114, height 5\' 2"  (1.575 m), weight 242 lb 8 oz (110 kg).

## 2016-09-27 ENCOUNTER — Telehealth: Payer: Self-pay

## 2016-09-27 DIAGNOSIS — N390 Urinary tract infection, site not specified: Secondary | ICD-10-CM

## 2016-09-27 MED ORDER — SULFAMETHOXAZOLE-TRIMETHOPRIM 800-160 MG PO TABS: 1.0000 | ORAL_TABLET | Freq: Two times a day (BID) | ORAL | 0 refills | Status: AC

## 2016-09-27 NOTE — Telephone Encounter (Signed)
We can start Bactrim DS, two tablets daily x 7.

## 2016-09-27 NOTE — Telephone Encounter (Signed)
Pt called stating she is in a lot of pain, severe dysuria on AZO, and is now running a fever. Pt stated that last night she had a 99.9 and this morning it was 98.9 on tylenol. Pt stated she just recently took her temp and it was 100.5. Please advise.

## 2016-09-27 NOTE — Telephone Encounter (Signed)
Spoke with pt and made aware abx was sent to pharmacy. Reinforced with pt that abx may need to be changed once ucx results are back. Pt voiced understanding.

## 2016-09-29 LAB — CULTURE, URINE COMPREHENSIVE

## 2016-09-30 ENCOUNTER — Telehealth: Payer: Self-pay

## 2016-09-30 DIAGNOSIS — N390 Urinary tract infection, site not specified: Secondary | ICD-10-CM

## 2016-09-30 MED ORDER — NITROFURANTOIN MONOHYD MACRO 100 MG PO CAPS: 100.0000 mg | ORAL_CAPSULE | Freq: Two times a day (BID) | ORAL | 0 refills | Status: DC

## 2016-09-30 NOTE — Telephone Encounter (Signed)
LMOM

## 2016-09-30 NOTE — Telephone Encounter (Signed)
-----   Message from Nori Riis, PA-C sent at 09/30/2016  8:14 AM EDT ----- Please let Mrs. Alaimo know that she has a positive urine culture.  She needs to start Macrobid 100 mg, one capsule bid for seven days.

## 2016-09-30 NOTE — Telephone Encounter (Signed)
Spoke with pt in reference to +ucx. On Tuesday pt was given bactrim due to running a low grade fever. Pt stated today is day 4 of bactrim and continues to run a low grade fever of 100.2. Pt requested a message be sent to Dr. Erlene Quan for advise about fever while on abx. At this point pt denied wanting macrobid. Please advise.

## 2016-09-30 NOTE — Telephone Encounter (Signed)
Spoke with pt in reference to abx and low grade fever. Made pt aware if temp gets to 101 or high to go to the ER. Macrobid sent to pharmacy. Pt voiced understanding of whole conversation.

## 2016-09-30 NOTE — Telephone Encounter (Signed)
I did not realize that Brooke Krueger had already given her Bactrim as I was [place] on the culture.  She should discontinue the Bactrim, the urine cultures pansensitive. Not particularly worried about low-grade temperatures, but if she spikes a high fever (101 or higher) she needs to go to the emergency room.  Hollice Espy, MD

## 2017-01-04 ENCOUNTER — Other Ambulatory Visit: Payer: Self-pay | Admitting: Infectious Diseases

## 2017-01-04 DIAGNOSIS — Z1231 Encounter for screening mammogram for malignant neoplasm of breast: Secondary | ICD-10-CM

## 2017-02-13 ENCOUNTER — Ambulatory Visit
Admission: RE | Admit: 2017-02-13 | Discharge: 2017-02-13 | Disposition: A | Discharge status: Home / Self Care | Payer: Medicare Other | Source: Ambulatory Visit | Attending: Infectious Diseases | Admitting: Infectious Diseases

## 2017-02-13 DIAGNOSIS — Z1231 Encounter for screening mammogram for malignant neoplasm of breast: Secondary | ICD-10-CM | POA: Insufficient documentation

## 2017-02-13 HISTORY — DX: Personal history of irradiation: Z92.3

## 2017-02-15 ENCOUNTER — Other Ambulatory Visit: Payer: Self-pay | Admitting: Infectious Diseases

## 2017-02-15 DIAGNOSIS — N6489 Other specified disorders of breast: Secondary | ICD-10-CM

## 2017-02-15 DIAGNOSIS — R928 Other abnormal and inconclusive findings on diagnostic imaging of breast: Secondary | ICD-10-CM

## 2017-02-21 ENCOUNTER — Ambulatory Visit
Admission: RE | Admit: 2017-02-21 | Discharge: 2017-02-21 | Disposition: A | Discharge status: Home / Self Care | Payer: Medicare Other | Source: Ambulatory Visit | Attending: Infectious Diseases | Admitting: Infectious Diseases

## 2017-02-21 DIAGNOSIS — N6489 Other specified disorders of breast: Secondary | ICD-10-CM | POA: Diagnosis not present

## 2017-02-21 DIAGNOSIS — R928 Other abnormal and inconclusive findings on diagnostic imaging of breast: Secondary | ICD-10-CM

## 2017-07-06 ENCOUNTER — Encounter: Payer: Self-pay | Admitting: *Deleted

## 2017-07-07 ENCOUNTER — Encounter: Payer: Self-pay | Admitting: *Deleted

## 2017-07-07 ENCOUNTER — Ambulatory Visit
Admission: RE | Admit: 2017-07-07 | Discharge: 2017-07-07 | Disposition: A | Discharge status: Home / Self Care | Payer: Medicare Other | Source: Ambulatory Visit | Attending: Unknown Physician Specialty | Admitting: Unknown Physician Specialty

## 2017-07-07 ENCOUNTER — Ambulatory Visit: Payer: Medicare Other | Admitting: Anesthesiology

## 2017-07-07 ENCOUNTER — Encounter
Admission: RE | Disposition: A | Discharge status: Home / Self Care | Payer: Self-pay | Source: Ambulatory Visit | Attending: Unknown Physician Specialty

## 2017-07-07 DIAGNOSIS — Z880 Allergy status to penicillin: Secondary | ICD-10-CM | POA: Diagnosis not present

## 2017-07-07 DIAGNOSIS — Z79899 Other long term (current) drug therapy: Secondary | ICD-10-CM | POA: Insufficient documentation

## 2017-07-07 DIAGNOSIS — Z923 Personal history of irradiation: Secondary | ICD-10-CM | POA: Insufficient documentation

## 2017-07-07 DIAGNOSIS — D122 Benign neoplasm of ascending colon: Secondary | ICD-10-CM | POA: Diagnosis not present

## 2017-07-07 DIAGNOSIS — I4891 Unspecified atrial fibrillation: Secondary | ICD-10-CM | POA: Diagnosis not present

## 2017-07-07 DIAGNOSIS — K5790 Diverticulosis of intestine, part unspecified, without perforation or abscess without bleeding: Secondary | ICD-10-CM | POA: Diagnosis not present

## 2017-07-07 DIAGNOSIS — M1990 Unspecified osteoarthritis, unspecified site: Secondary | ICD-10-CM | POA: Insufficient documentation

## 2017-07-07 DIAGNOSIS — Z90710 Acquired absence of both cervix and uterus: Secondary | ICD-10-CM | POA: Diagnosis not present

## 2017-07-07 DIAGNOSIS — K640 First degree hemorrhoids: Secondary | ICD-10-CM | POA: Diagnosis not present

## 2017-07-07 DIAGNOSIS — Z1211 Encounter for screening for malignant neoplasm of colon: Secondary | ICD-10-CM | POA: Diagnosis not present

## 2017-07-07 DIAGNOSIS — G4730 Sleep apnea, unspecified: Secondary | ICD-10-CM | POA: Insufficient documentation

## 2017-07-07 DIAGNOSIS — K5909 Other constipation: Secondary | ICD-10-CM | POA: Diagnosis not present

## 2017-07-07 DIAGNOSIS — Z800 Family history of malignant neoplasm of digestive organs: Secondary | ICD-10-CM | POA: Insufficient documentation

## 2017-07-07 DIAGNOSIS — Z7901 Long term (current) use of anticoagulants: Secondary | ICD-10-CM | POA: Diagnosis not present

## 2017-07-07 DIAGNOSIS — Z803 Family history of malignant neoplasm of breast: Secondary | ICD-10-CM | POA: Diagnosis not present

## 2017-07-07 DIAGNOSIS — M109 Gout, unspecified: Secondary | ICD-10-CM | POA: Insufficient documentation

## 2017-07-07 DIAGNOSIS — K219 Gastro-esophageal reflux disease without esophagitis: Secondary | ICD-10-CM | POA: Insufficient documentation

## 2017-07-07 DIAGNOSIS — I10 Essential (primary) hypertension: Secondary | ICD-10-CM | POA: Insufficient documentation

## 2017-07-07 DIAGNOSIS — Z853 Personal history of malignant neoplasm of breast: Secondary | ICD-10-CM | POA: Insufficient documentation

## 2017-07-07 DIAGNOSIS — E785 Hyperlipidemia, unspecified: Secondary | ICD-10-CM | POA: Diagnosis not present

## 2017-07-07 DIAGNOSIS — Z8542 Personal history of malignant neoplasm of other parts of uterus: Secondary | ICD-10-CM | POA: Diagnosis not present

## 2017-07-07 DIAGNOSIS — Z86010 Personal history of colonic polyps: Secondary | ICD-10-CM | POA: Diagnosis not present

## 2017-07-07 DIAGNOSIS — R011 Cardiac murmur, unspecified: Secondary | ICD-10-CM | POA: Insufficient documentation

## 2017-07-07 DIAGNOSIS — Z820 Family history of epilepsy and other diseases of the nervous system: Secondary | ICD-10-CM | POA: Insufficient documentation

## 2017-07-07 DIAGNOSIS — Z87891 Personal history of nicotine dependence: Secondary | ICD-10-CM | POA: Insufficient documentation

## 2017-07-07 DIAGNOSIS — Z886 Allergy status to analgesic agent status: Secondary | ICD-10-CM | POA: Diagnosis not present

## 2017-07-07 DIAGNOSIS — Z8249 Family history of ischemic heart disease and other diseases of the circulatory system: Secondary | ICD-10-CM | POA: Insufficient documentation

## 2017-07-07 HISTORY — DX: Cardiac murmur, unspecified: R01.1

## 2017-07-07 HISTORY — PX: COLONOSCOPY WITH PROPOFOL: SHX5780

## 2017-07-07 HISTORY — DX: Other constipation: K59.09

## 2017-07-07 HISTORY — DX: Personal history of colonic polyps: Z86.010

## 2017-07-07 HISTORY — DX: Personal history of other diseases of the musculoskeletal system and connective tissue: Z87.39

## 2017-07-07 HISTORY — DX: Unspecified atrial fibrillation: I48.91

## 2017-07-07 HISTORY — DX: Hyperuricemia without signs of inflammatory arthritis and tophaceous disease: E79.0

## 2017-07-07 HISTORY — DX: Diverticulosis of intestine, part unspecified, without perforation or abscess without bleeding: K57.90

## 2017-07-07 HISTORY — DX: Personal history of adenomatous and serrated colon polyps: Z86.0101

## 2017-07-07 SURGERY — COLONOSCOPY WITH PROPOFOL
Anesthesia: General

## 2017-07-07 MED ORDER — SODIUM CHLORIDE 0.9 % IV SOLN
INTRAVENOUS | Status: DC
  Administered 2017-07-07: 10:00:00 via INTRAVENOUS

## 2017-07-07 MED ORDER — PROPOFOL 500 MG/50ML IV EMUL
INTRAVENOUS | Status: DC | PRN
  Administered 2017-07-07: 100 ug/kg/min via INTRAVENOUS

## 2017-07-07 MED ORDER — PROPOFOL 10 MG/ML IV BOLUS
INTRAVENOUS | Status: DC | PRN
  Administered 2017-07-07: 90 mg via INTRAVENOUS

## 2017-07-07 MED ORDER — SODIUM CHLORIDE 0.9 % IV SOLN: INTRAVENOUS | Status: DC

## 2017-07-07 MED ORDER — PROPOFOL 500 MG/50ML IV EMUL
INTRAVENOUS | Status: AC
  Filled 2017-07-07: qty 50

## 2017-07-07 NOTE — Op Note (Signed)
[facility] Gastroenterology Patient Name: Brooke Krueger Procedure Date: 07/07/2017 10:26 AM MRN: [id number] Account #: [id number] Date of Birth: 06/10/1940 Admit Type: Outpatient Age: 77 Room: [facility] ENDO ROOM 4 Gender: Female Note Status: Finalized Procedure:            Colonoscopy Indications:          High risk colon cancer surveillance: Personal history                        of colonic polyps Providers:            Manya Silvas, MD Referring MD:         Adrian Prows (Referring MD) Medicines:            Propofol per Anesthesia Complications:        No immediate complications. Procedure:            Pre-Anesthesia Assessment:                       - After reviewing the risks and benefits, the patient                        was deemed in satisfactory condition to undergo the                        procedure.                       After obtaining informed consent, the colonoscope was                        passed under direct vision. Throughout the procedure,                        the patient's blood pressure, pulse, and oxygen                        saturations were monitored continuously. The                        Colonoscope was introduced through the anus and                        advanced to the the cecum, identified by appendiceal                        orifice and ileocecal valve. The colonoscopy was                        performed without difficulty. The patient tolerated the                        procedure well. The quality of the bowel preparation                        was good. Findings:      A diminutive polyp was found in the ascending colon. The polyp was       sessile. The polyp was removed with a jumbo cold forceps. Resection and       retrieval were complete.      Multiple medium-mouthed diverticula were found in  the sigmoid colon,       descending colon and transverse colon.      Internal hemorrhoids were found during endoscopy. The  hemorrhoids were       small and Grade I (internal hemorrhoids that do not prolapse).      The exam was otherwise without abnormality. Impression:           - One diminutive polyp in the ascending colon, removed                        with a jumbo cold forceps. Resected and retrieved.                       - Diverticulosis in the sigmoid colon, in the                        descending colon and in the transverse colon.                       - Internal hemorrhoids.                       - The examination was otherwise normal. Recommendation:       - Await pathology results. Manya Silvas, MD 07/07/2017 11:00:48 AM This report has been signed electronically. Number of Addenda: 0 Note Initiated On: 07/07/2017 10:26 AM Scope Withdrawal Time: 0 hours 11 minutes 48 seconds  Total Procedure Duration: 0 hours 15 minutes 21 seconds       [facility]

## 2017-07-07 NOTE — Anesthesia Postprocedure Evaluation (Signed)
Anesthesia Post Note  Patient: Brooke Krueger  Procedure(s) Performed: COLONOSCOPY WITH PROPOFOL (N/A )  Patient location during evaluation: Endoscopy Anesthesia Type: General Level of consciousness: awake and alert Pain management: pain level controlled Vital Signs Assessment: post-procedure vital signs reviewed and stable Respiratory status: spontaneous breathing and respiratory function stable Cardiovascular status: stable Anesthetic complications: no     Last Vitals:  Vitals:   07/07/17 0946 07/07/17 1100  BP: 99/72 94/63  Pulse: 95   Resp: 20   Temp: (!) 36.1 C (!) 36.3 C  SpO2: 98%     Last Pain:  Vitals:   07/07/17 1100  TempSrc: Tympanic  PainSc:                  Ambermarie Honeyman K

## 2017-07-07 NOTE — Transfer of Care (Signed)
Immediate Anesthesia Transfer of Care Note  Patient: [place] Kreitzer Castilla  Procedure(s) Performed: COLONOSCOPY WITH PROPOFOL (N/A )  Patient Location: Endoscopy Unit  Anesthesia Type:General  Level of Consciousness: drowsy and patient cooperative  Airway & Oxygen Therapy: Patient Spontanous Breathing and Patient connected to nasal cannula oxygen  Post-op Assessment: Report given to RN and Post -op Vital signs reviewed and stable  Post vital signs: Reviewed and stable  Last Vitals:  Vitals Value Taken Time  BP 94/63 07/07/2017 11:00 AM  Temp 36.3 C 07/07/2017 11:00 AM  Pulse 92 07/07/2017 11:01 AM  Resp 24 07/07/2017 11:01 AM  SpO2 100 % 07/07/2017 11:01 AM  Vitals shown include unvalidated device data.  Last Pain:  Vitals:   07/07/17 1100  TempSrc: Tympanic  PainSc:       Patients Stated Pain Goal: 0 (29/93/71 6967)  Complications: No apparent anesthesia complications

## 2017-07-07 NOTE — Anesthesia Preprocedure Evaluation (Signed)
Anesthesia Evaluation  Patient identified by MRN, date of birth, ID band Patient awake    Reviewed: Allergy & Precautions, NPO status , Patient's Chart, lab work & pertinent test results  History of Anesthesia Complications Negative for: history of anesthetic complications  Airway Mallampati: II       Dental   Pulmonary sleep apnea and Continuous Positive Airway Pressure Ventilation , neg COPD, former smoker,           Cardiovascular hypertension, Pt. on medications and Pt. on home beta blockers (-) Past MI and (-) CHF + dysrhythmias Atrial Fibrillation (-) Valvular Problems/Murmurs     Neuro/Psych neg Seizures    GI/Hepatic Neg liver ROS, GERD  Medicated and Controlled,  Endo/Other  neg diabetes  Renal/GU negative Renal ROS     Musculoskeletal   Abdominal   Peds  Hematology   Anesthesia Other Findings   Reproductive/Obstetrics                             Anesthesia Physical Anesthesia Plan  ASA: III  Anesthesia Plan: General   Post-op Pain Management:    Induction:   PONV Risk Score and Plan: 3 and TIVA, Propofol infusion and Treatment may vary due to age or medical condition  Airway Management Planned: Nasal Cannula  Additional Equipment:   Intra-op Plan:   Post-operative Plan:   Informed Consent: I have reviewed the patients History and Physical, chart, labs and discussed the procedure including the risks, benefits and alternatives for the proposed anesthesia with the patient or authorized representative who has indicated his/her understanding and acceptance.     Plan Discussed with:   Anesthesia Plan Comments:         Anesthesia Quick Evaluation

## 2017-07-07 NOTE — OR Nursing (Signed)
Dr. Vira Agar asked to do H+P update.

## 2017-07-07 NOTE — H&P (Signed)
Primary Care Physician:  Leonel Ramsay, MD Primary Gastroenterologist:  Dr. Vira Agar  Pre-Procedure History & Physical: HPI:  Brooke Krueger is a 77 y.o. female is here for an colonoscopy.  Done for personal history of colon polyps.   Past Medical History:  Diagnosis Date  . Abdominal fibromatosis   . Arthritis   . Atrial fibrillation ([place])   . Breast cancer ([place]) 2002   right lumpectomy  . Chronic constipation   . Diverticulosis   . Endometrial cancer ([place]) 2013  . GERD (gastroesophageal reflux disease)   . Heart murmur   . History of adenomatous polyp of colon   . History of gout   . Hyperlipemia   . Hypertension   . Hyperuricemia   . Personal history of radiation therapy 2002   right breast ca  . Sleep apnea     Past Surgical History:  Procedure Laterality Date  . ABDOMINAL HYSTERECTOMY    . APPENDECTOMY    . BREAST LUMPECTOMY Right 2002  . COLONOSCOPY    . THORACOSCOPY  1988  . TONSILLECTOMY      Prior to Admission medications   Medication Sig Start Date End Date Taking? Authorizing Provider  atorvastatin (LIPITOR) 20 MG tablet  11/24/15  Yes [provider]  calcium carbonate (OS-CAL - DOSED IN MG OF ELEMENTAL CALCIUM) 1250 (500 Ca) MG tablet Take 1 tablet by mouth.   Yes [provider]  Cranberry 200 MG CAPS Take by mouth.   Yes [provider]  [employer] D3 2000 units CAPS TAKE 1 CAPSULE (2,000 UNITS TOTAL) BY MOUTH ONCE DAILY. 10/30/15  Yes [provider]  hydrochlorothiazide (HYDRODIURIL) 25 MG tablet  11/23/15  Yes [provider]  LUMIGAN 0.01 % SOLN  11/03/15  Yes [provider]  metoprolol succinate (TOPROL-XL) 50 MG 24 hr tablet  10/01/15  Yes [provider]  omeprazole (PRILOSEC) 20 MG capsule Take 20 mg by mouth daily.   Yes [provider]  oxybutynin (DITROPAN XL) 15 MG 24 hr tablet  11/24/15  Yes [provider]  XARELTO 20 MG TABS tablet  10/30/15  Yes [provider]  nitrofurantoin, macrocrystal-monohydrate, (MACROBID) 100 MG capsule Take 1 capsule (100 mg total) by mouth every 12 (twelve) hours. Patient not taking: Reported on 07/07/2017 09/30/16   Zara Council A, PA-C    Allergies as of 06/08/2017 - Review Complete 12/16/2015  Allergen Reaction Noted  . Aspirin Other (See Comments) 05/16/2013  . Penicillins Rash 05/16/2013    Family History  Problem Relation Age of Onset  . Breast cancer Mother 39  . Aortic stenosis Mother   . Dementia Mother   . Colon polyps Mother   . Breast cancer Sister 22  . Breast cancer Other   . Pancreatic cancer Father   . Pancreatic cancer Brother   . Bladder Cancer Neg Hx   . Prostate cancer Neg Hx   . Kidney cancer Neg Hx     Social History   Socioeconomic History  . Marital status: Single    Spouse name: Not on file  . Number of children: Not on file  . Years of education: Not on file  . Highest education level: Not on file  Occupational History  . Not on file  Social Needs  . Financial resource strain: Not on file  . Food insecurity:    Worry: Not on file    Inability: Not on file  . Transportation needs:  Medical: Not on file    Non-medical: Not on file  Tobacco Use  . Smoking status: Former Research scientist (life sciences)  . Smokeless tobacco: Never Used  Substance and Sexual Activity  . Alcohol use: Yes  . Drug use: No  . Sexual activity: Not on file  Lifestyle  . Physical activity:    Days per week: Not on file    Minutes per session: Not on file  . Stress: Not on file  Relationships  . Social connections:    Talks on phone: Not on file    Gets together: Not on file    Attends religious service: Not on file    Active member of club or organization: Not on file    Attends meetings of clubs or organizations: Not on file    Relationship status: Not on file  . Intimate partner violence:    Fear of current or ex partner: Not on file    Emotionally abused: Not on file    Physically  abused: Not on file    Forced sexual activity: Not on file  Other Topics Concern  . Not on file  Social History Narrative  . Not on file    Review of Systems: See HPI, otherwise negative ROS  Physical Exam: BP 94/63   Pulse 95   Temp (!) 97.3 F (36.3 C) (Tympanic)   Resp 20   Ht 5' 2.5" (1.588 m)   Wt 108.9 kg (240 lb)   SpO2 98%   BMI 43.20 kg/m  General:   Alert,  pleasant and cooperative in NAD Head:  Normocephalic and atraumatic. Neck:  Supple; no masses or thyromegaly. Lungs:  Clear throughout to auscultation.    Heart:  Regular rate and rhythm. Abdomen:  Soft, nontender and nondistended. Normal bowel sounds, without guarding, and without rebound.   Neurologic:  Alert and  oriented x4;  grossly normal neurologically.  Impression/Plan: Brooke Krueger is here for an colonoscopy to be performed for personal history of colon polyps.  Risks, benefits, limitations, and alternatives regarding  colonoscopy have been reviewed with the patient.  Questions have been answered.  All parties agreeable.   Gaylyn Cheers, MD  07/07/2017, 11:02 AM

## 2017-07-07 NOTE — Anesthesia Post-op Follow-up Note (Signed)
Anesthesia QCDR form completed.        

## 2017-07-11 ENCOUNTER — Encounter: Payer: Self-pay | Admitting: Unknown Physician Specialty

## 2017-07-11 LAB — SURGICAL PATHOLOGY

## 2017-07-18 NOTE — Progress Notes (Signed)
07/05/2017- Stress test on chart from Cardiology [facility]  06/28/2017- EKG on chart from Cardiology [facility]  12/26/2016- Clearance from Dr. Ola Spurr on chart.

## 2017-07-18 NOTE — Patient Instructions (Signed)
[facility]  07/18/2017   Your procedure is scheduled on: Monday 07/24/2017  Report to [facility] Main  Entrance              Report to admitting at   0530  AM    Call this number if you have problems the morning of surgery [phone]    Remember: Do not eat food or drink liquids :After Midnight.     Take these medicines the morning of surgery with A SIP OF WATER: Metoprolol succinate (Toprol-XL)                                You may not have any metal on your body including hair pins and              piercings  Do not wear jewelry, make-up, lotions, powders or perfumes, deodorant             Do not wear nail polish.  Do not shave  48 hours prior to surgery.                Do not bring valuables to the hospital. [place].  Contacts, dentures or bridgework may not be worn into surgery.  Leave suitcase in the car. After surgery it may be brought to your room.                   Please read over the following fact sheets you were given: _____________________________________________________________________             [facility] - Preparing for Surgery Before surgery, you can play an important role.  Because skin is not sterile, your skin needs to be as free of germs as possible.  You can reduce the number of germs on your skin by washing with CHG (chlorahexidine gluconate) soap before surgery.  CHG is an antiseptic cleaner which kills germs and bonds with the skin to continue killing germs even after washing. Please DO NOT use if you have an allergy to CHG or antibacterial soaps.  If your skin becomes reddened/irritated stop using the CHG and inform your nurse when you arrive at Short Stay. Do not shave (including legs and underarms) for at least 48 hours prior to the first CHG shower.  You may shave your face/neck. Please follow these instructions carefully:  1.  Shower with CHG Soap the night  before surgery and the  morning of Surgery.  2.  If you choose to wash your hair, wash your hair first as usual with your  normal  shampoo.  3.  After you shampoo, rinse your hair and body thoroughly to remove the  shampoo.                           4.  Use CHG as you would any other liquid soap.  You can apply chg directly  to the skin and wash                       Gently with a scrungie or clean washcloth.  5.  Apply the CHG Soap to your body ONLY FROM THE NECK DOWN.   Do  not use on face/ open                           Wound or open sores. Avoid contact with eyes, ears mouth and genitals (private parts).                       Wash face,  Genitals (private parts) with your normal soap.             6.  Wash thoroughly, paying special attention to the area where your surgery  will be performed.  7.  Thoroughly rinse your body with warm water from the neck down.  8.  DO NOT shower/wash with your normal soap after using and rinsing off  the CHG Soap.                9.  Pat yourself dry with a clean towel.            10.  Wear clean pajamas.            11.  Place clean sheets on your bed the night of your first shower and do not  sleep with pets. Day of Surgery : Do not apply any lotions/deodorants the morning of surgery.  Please wear clean clothes to the hospital/surgery center.  FAILURE TO FOLLOW THESE INSTRUCTIONS MAY RESULT IN THE CANCELLATION OF YOUR SURGERY PATIENT SIGNATURE_________________________________  NURSE SIGNATURE__________________________________  ________________________________________________________________________   Adam Phenix  An incentive spirometer is a tool that can help keep your lungs clear and active. This tool measures how well you are filling your lungs with each breath. Taking long deep breaths may help reverse or decrease the chance of developing breathing (pulmonary) problems (especially infection) following:  A long period of time when you are  unable to move or be active. BEFORE THE PROCEDURE   If the spirometer includes an indicator to show your best effort, your nurse or respiratory therapist will set it to a desired goal.  If possible, sit up straight or lean slightly forward. Try not to slouch.  Hold the incentive spirometer in an upright position. INSTRUCTIONS FOR USE  1. Sit on the edge of your bed if possible, or sit up as far as you can in bed or on a chair. 2. Hold the incentive spirometer in an upright position. 3. Breathe out normally. 4. Place the mouthpiece in your mouth and seal your lips tightly around it. 5. Breathe in slowly and as deeply as possible, raising the piston or the ball toward the top of the column. 6. Hold your breath for 3-5 seconds or for as long as possible. Allow the piston or ball to fall to the bottom of the column. 7. Remove the mouthpiece from your mouth and breathe out normally. 8. Rest for a few seconds and repeat Steps 1 through 7 at least 10 times every 1-2 hours when you are awake. Take your time and take a few normal breaths between deep breaths. 9. The spirometer may include an indicator to show your best effort. Use the indicator as a goal to work toward during each repetition. 10. After each set of 10 deep breaths, practice coughing to be sure your lungs are clear. If you have an incision (the cut made at the time of surgery), support your incision when coughing by placing a pillow or rolled up towels firmly against it. Once you are able to get out of bed,  walk around indoors and cough well. You may stop using the incentive spirometer when instructed by your caregiver.  RISKS AND COMPLICATIONS  Take your time so you do not get dizzy or light-headed.  If you are in pain, you may need to take or ask for pain medication before doing incentive spirometry. It is harder to take a deep breath if you are having pain. AFTER USE  Rest and breathe slowly and easily.  It can be helpful to  keep track of a log of your progress. Your caregiver can provide you with a simple table to help with this. If you are using the spirometer at home, follow these instructions: [place] IF:   You are having difficultly using the spirometer.  You have trouble using the spirometer as often as instructed.  Your pain medication is not giving enough relief while using the spirometer.  You develop fever of 100.5 F (38.1 C) or higher. SEEK IMMEDIATE MEDICAL CARE IF:   You cough up bloody sputum that had not been present before.  You develop fever of 102 F (38.9 C) or greater.  You develop worsening pain at or near the incision site. MAKE SURE YOU:   Understand these instructions.  Will watch your condition.  Will get help right away if you are not doing well or get worse. Document Released: 06/13/2006 Document Revised: 04/25/2011 Document Reviewed: 08/14/2006 ExitCare Patient Information 2014 ExitCare, [place].   ________________________________________________________________________  WHAT IS A BLOOD TRANSFUSION? Blood Transfusion Information  A transfusion is the replacement of blood or some of its parts. Blood is made up of multiple cells which provide different functions.  Red blood cells carry oxygen and are used for blood loss replacement.  White blood cells fight against infection.  Platelets control bleeding.  Plasma helps clot blood.  Other blood products are available for specialized needs, such as hemophilia or other clotting disorders. BEFORE THE TRANSFUSION  Who gives blood for transfusions?   Healthy volunteers who are fully evaluated to make sure their blood is safe. This is blood bank blood. Transfusion therapy is the safest it has ever been in the practice of medicine. Before blood is taken from a donor, a complete history is taken to make sure that person has no history of diseases nor engages in risky social behavior (examples are intravenous drug  use or sexual activity with multiple partners). The donor's travel history is screened to minimize risk of transmitting infections, such as malaria. The donated blood is tested for signs of infectious diseases, such as HIV and hepatitis. The blood is then tested to be sure it is compatible with you in order to minimize the chance of a transfusion reaction. If you or a relative donates blood, this is often done in anticipation of surgery and is not appropriate for emergency situations. It takes many days to process the donated blood. RISKS AND COMPLICATIONS Although transfusion therapy is very safe and saves many lives, the main dangers of transfusion include:   Getting an infectious disease.  Developing a transfusion reaction. This is an allergic reaction to something in the blood you were given. Every precaution is taken to prevent this. The decision to have a blood transfusion has been considered carefully by your caregiver before blood is given. Blood is not given unless the benefits outweigh the risks. AFTER THE TRANSFUSION  Right after receiving a blood transfusion, you will usually feel much better and more energetic. This is especially true if your red blood cells  have gotten low (anemic). The transfusion raises the level of the red blood cells which carry oxygen, and this usually causes an energy increase.  The nurse administering the transfusion will monitor you carefully for complications. HOME CARE INSTRUCTIONS  No special instructions are needed after a transfusion. You may find your energy is better. Speak with your caregiver about any limitations on activity for underlying diseases you may have. SEEK MEDICAL CARE IF:   Your condition is not improving after your transfusion.  You develop redness or irritation at the intravenous (IV) site. SEEK IMMEDIATE MEDICAL CARE IF:  Any of the following symptoms occur over the next 12 hours:  Shaking chills.  You have a temperature by mouth  above 102 F (38.9 C), not controlled by medicine.  Chest, back, or muscle pain.  People around you feel you are not acting correctly or are confused.  Shortness of breath or difficulty breathing.  Dizziness and fainting.  You get a rash or develop hives.  You have a decrease in urine output.  Your urine turns a dark color or changes to pink, red, or brown. Any of the following symptoms occur over the next 10 days:  You have a temperature by mouth above 102 F (38.9 C), not controlled by medicine.  Shortness of breath.  Weakness after normal activity.  The white part of the eye turns yellow (jaundice).  You have a decrease in the amount of urine or are urinating less often.  Your urine turns a dark color or changes to pink, red, or brown. Document Released: 01/29/2000 Document Revised: 04/25/2011 Document Reviewed: 09/17/2007 [facility] Patient Information 2014 [place], [place].  _______________________________________________________________________

## 2017-07-19 ENCOUNTER — Encounter (HOSPITAL_COMMUNITY)
Admission: RE | Admit: 2017-07-19 | Discharge: 2017-07-19 | Disposition: A | Discharge status: Home / Self Care | Payer: Medicare Other | Source: Ambulatory Visit | Attending: Orthopedic Surgery | Admitting: Orthopedic Surgery

## 2017-07-19 ENCOUNTER — Encounter (HOSPITAL_COMMUNITY): Payer: Self-pay

## 2017-07-19 ENCOUNTER — Other Ambulatory Visit (HOSPITAL_COMMUNITY): Payer: BLUE CROSS/BLUE SHIELD

## 2017-07-19 ENCOUNTER — Other Ambulatory Visit: Payer: Self-pay

## 2017-07-19 DIAGNOSIS — M1711 Unilateral primary osteoarthritis, right knee: Secondary | ICD-10-CM | POA: Insufficient documentation

## 2017-07-19 DIAGNOSIS — Z01812 Encounter for preprocedural laboratory examination: Secondary | ICD-10-CM | POA: Insufficient documentation

## 2017-07-19 DIAGNOSIS — Z0183 Encounter for blood typing: Secondary | ICD-10-CM | POA: Diagnosis not present

## 2017-07-19 HISTORY — DX: Dyspnea, unspecified: R06.00

## 2017-07-19 HISTORY — DX: Personal history of urinary calculi: Z87.442

## 2017-07-19 HISTORY — DX: Cardiac arrhythmia, unspecified: I49.9

## 2017-07-19 LAB — CBC
HCT: 40.9 % (ref 36.0–46.0)
Hemoglobin: 12.9 g/dL (ref 12.0–15.0)
MCH: 28.2 pg (ref 26.0–34.0)
MCHC: 31.5 g/dL (ref 30.0–36.0)
MCV: 89.5 fL (ref 78.0–100.0)
Platelets: 180 10*3/uL (ref 150–400)
RBC: 4.57 MIL/uL (ref 3.87–5.11)
RDW: 15.6 % — ABNORMAL HIGH (ref 11.5–15.5)
WBC: 8.6 10*3/uL (ref 4.0–10.5)

## 2017-07-19 LAB — URINALYSIS, ROUTINE W REFLEX MICROSCOPIC
Bilirubin Urine: NEGATIVE
Glucose, UA: NEGATIVE mg/dL
Hgb urine dipstick: NEGATIVE
Ketones, ur: NEGATIVE mg/dL
Leukocytes, UA: NEGATIVE
Nitrite: NEGATIVE
Protein, ur: NEGATIVE mg/dL
Specific Gravity, Urine: 1.02 (ref 1.005–1.030)
pH: 6 (ref 5.0–8.0)

## 2017-07-19 LAB — COMPREHENSIVE METABOLIC PANEL
ALT: 17 U/L (ref 14–54)
AST: 17 U/L (ref 15–41)
Albumin: 4.3 g/dL (ref 3.5–5.0)
Alkaline Phosphatase: 83 U/L (ref 38–126)
Anion gap: 7 (ref 5–15)
BUN: 17 mg/dL (ref 6–20)
CO2: 32 mmol/L (ref 22–32)
Calcium: 9.7 mg/dL (ref 8.9–10.3)
Chloride: 104 mmol/L (ref 101–111)
Creatinine, Ser: 0.69 mg/dL (ref 0.44–1.00)
GFR calc Af Amer: >60 mL/min (ref >60)
GFR calc non Af Amer: >60 mL/min (ref >60)
Glucose, Bld: 101 mg/dL — ABNORMAL HIGH (ref 65–99)
Potassium: 4.4 mmol/L (ref 3.5–5.1)
Sodium: 143 mmol/L (ref 135–145)
Total Bilirubin: 1.1 mg/dL (ref 0.3–1.2)
Total Protein: 7.5 g/dL (ref 6.5–8.1)

## 2017-07-19 LAB — PROTIME-INR
INR: 1.75
Prothrombin Time: 20.3 seconds — ABNORMAL HIGH (ref 11.4–15.2)

## 2017-07-19 LAB — ABO/RH: ABO/RH(D): A POS

## 2017-07-19 LAB — SURGICAL PCR SCREEN
MRSA, PCR: NEGATIVE
STAPHYLOCOCCUS AUREUS: POSITIVE — AB

## 2017-07-19 LAB — APTT: aPTT: 42 seconds — ABNORMAL HIGH (ref 24–36)

## 2017-07-19 NOTE — Progress Notes (Signed)
Chart left with [facility] to follow up on clearance from Dr. Francesca Jewett.and a PCR.

## 2017-07-19 NOTE — Progress Notes (Signed)
PT and PTT   Done 07/19/17 routed to Dr. Wynelle Link via epic

## 2017-07-21 NOTE — Progress Notes (Signed)
RN f/u per [facility] RN request to obtain clearance from Dr Wynelle Link office . RN spoke with new surgery scheduler Glendale Chard  Who informed this RN that [place] surgery had been cancelled due to inability to obtain clearance.

## 2017-07-24 ENCOUNTER — Inpatient Hospital Stay (HOSPITAL_COMMUNITY): Admission: RE | Admit: 2017-07-24 | Payer: Medicare Other | Source: Ambulatory Visit | Admitting: Orthopedic Surgery

## 2017-07-24 ENCOUNTER — Encounter (HOSPITAL_COMMUNITY): Admission: RE | Payer: Self-pay | Source: Ambulatory Visit

## 2017-07-24 LAB — TYPE AND SCREEN
ABO/RH(D): A POS
Antibody Screen: NEGATIVE

## 2017-07-24 SURGERY — ARTHROPLASTY, KNEE, TOTAL
Anesthesia: Choice | Site: Knee | Laterality: Right

## 2017-10-04 ENCOUNTER — Encounter: Payer: Self-pay | Admitting: *Deleted

## 2017-10-10 ENCOUNTER — Other Ambulatory Visit: Payer: Self-pay

## 2017-10-10 ENCOUNTER — Ambulatory Visit
Admission: RE | Admit: 2017-10-10 | Discharge: 2017-10-10 | Disposition: A | Discharge status: Home / Self Care | Payer: Medicare Other | Source: Ambulatory Visit | Attending: Ophthalmology | Admitting: Ophthalmology

## 2017-10-10 ENCOUNTER — Encounter
Admission: RE | Disposition: A | Discharge status: Home / Self Care | Payer: Self-pay | Source: Ambulatory Visit | Attending: Ophthalmology

## 2017-10-10 ENCOUNTER — Ambulatory Visit: Payer: Medicare Other | Admitting: Certified Registered"

## 2017-10-10 DIAGNOSIS — K219 Gastro-esophageal reflux disease without esophagitis: Secondary | ICD-10-CM | POA: Insufficient documentation

## 2017-10-10 DIAGNOSIS — Z7901 Long term (current) use of anticoagulants: Secondary | ICD-10-CM | POA: Diagnosis not present

## 2017-10-10 DIAGNOSIS — R011 Cardiac murmur, unspecified: Secondary | ICD-10-CM | POA: Insufficient documentation

## 2017-10-10 DIAGNOSIS — G4730 Sleep apnea, unspecified: Secondary | ICD-10-CM | POA: Diagnosis not present

## 2017-10-10 DIAGNOSIS — Z880 Allergy status to penicillin: Secondary | ICD-10-CM | POA: Insufficient documentation

## 2017-10-10 DIAGNOSIS — I10 Essential (primary) hypertension: Secondary | ICD-10-CM | POA: Diagnosis not present

## 2017-10-10 DIAGNOSIS — H2511 Age-related nuclear cataract, right eye: Secondary | ICD-10-CM | POA: Insufficient documentation

## 2017-10-10 DIAGNOSIS — Z886 Allergy status to analgesic agent status: Secondary | ICD-10-CM | POA: Insufficient documentation

## 2017-10-10 DIAGNOSIS — I4891 Unspecified atrial fibrillation: Secondary | ICD-10-CM | POA: Insufficient documentation

## 2017-10-10 DIAGNOSIS — Z9989 Dependence on other enabling machines and devices: Secondary | ICD-10-CM | POA: Diagnosis not present

## 2017-10-10 DIAGNOSIS — Z888 Allergy status to other drugs, medicaments and biological substances status: Secondary | ICD-10-CM | POA: Diagnosis not present

## 2017-10-10 DIAGNOSIS — Z79899 Other long term (current) drug therapy: Secondary | ICD-10-CM | POA: Diagnosis not present

## 2017-10-10 HISTORY — DX: Congenital malformation of nose, unspecified: Q30.9

## 2017-10-10 HISTORY — PX: CATARACT EXTRACTION W/PHACO: SHX586

## 2017-10-10 SURGERY — PHACOEMULSIFICATION, CATARACT, WITH IOL INSERTION
Anesthesia: Monitor Anesthesia Care | Site: Eye | Laterality: Right | Wound class: Clean

## 2017-10-10 MED ORDER — NA CHONDROIT SULF-NA HYALURON 40-17 MG/ML IO SOLN
INTRAOCULAR | Status: AC
  Filled 2017-10-10: qty 1

## 2017-10-10 MED ORDER — GLYCOPYRROLATE 0.2 MG/ML IJ SOLN
INTRAMUSCULAR | Status: DC | PRN
  Administered 2017-10-10 (×2): 0.1 mg via INTRAVENOUS

## 2017-10-10 MED ORDER — GLYCOPYRROLATE 0.2 MG/ML IJ SOLN
INTRAMUSCULAR | Status: AC
  Filled 2017-10-10: qty 1

## 2017-10-10 MED ORDER — PHENYLEPHRINE HCL 10 % OP SOLN
OPHTHALMIC | Status: AC
  Administered 2017-10-10: 1 [drp] via OPHTHALMIC
  Filled 2017-10-10: qty 5

## 2017-10-10 MED ORDER — EPINEPHRINE PF 1 MG/ML IJ SOLN
INTRAOCULAR | Status: DC | PRN
  Administered 2017-10-10: 1 mL via OPHTHALMIC

## 2017-10-10 MED ORDER — CYCLOPENTOLATE HCL 2 % OP SOLN
OPHTHALMIC | Status: AC
Start: 2017-10-10 — End: 2017-10-10
  Administered 2017-10-10: 1 [drp] via OPHTHALMIC
  Filled 2017-10-10: qty 2

## 2017-10-10 MED ORDER — POVIDONE-IODINE 5 % OP SOLN
OPHTHALMIC | Status: AC
  Filled 2017-10-10: qty 30

## 2017-10-10 MED ORDER — MOXIFLOXACIN HCL 0.5 % OP SOLN
OPHTHALMIC | Status: AC
  Filled 2017-10-10: qty 3

## 2017-10-10 MED ORDER — SODIUM CHLORIDE 0.9 % IV SOLN
INTRAVENOUS | Status: DC
  Administered 2017-10-10: 10:00:00 via INTRAVENOUS

## 2017-10-10 MED ORDER — MOXIFLOXACIN HCL 0.5 % OP SOLN
OPHTHALMIC | Status: DC | PRN
  Administered 2017-10-10: .2 mL via OPHTHALMIC

## 2017-10-10 MED ORDER — BSS IO SOLN
INTRAOCULAR | Status: DC | PRN
  Administered 2017-10-10: 2 mL via OPHTHALMIC

## 2017-10-10 MED ORDER — CYCLOPENTOLATE HCL 2 % OP SOLN
1.0000 [drp] | OPHTHALMIC | Status: AC
  Administered 2017-10-10 (×4): 1 [drp] via OPHTHALMIC

## 2017-10-10 MED ORDER — FENTANYL CITRATE (PF) 100 MCG/2ML IJ SOLN
INTRAMUSCULAR | Status: AC
  Filled 2017-10-10: qty 2

## 2017-10-10 MED ORDER — POVIDONE-IODINE 5 % OP SOLN
OPHTHALMIC | Status: DC | PRN
  Administered 2017-10-10: 1 via OPHTHALMIC

## 2017-10-10 MED ORDER — EPINEPHRINE PF 1 MG/ML IJ SOLN
INTRAMUSCULAR | Status: AC
  Filled 2017-10-10: qty 1

## 2017-10-10 MED ORDER — MOXIFLOXACIN HCL 0.5 % OP SOLN: 1.0000 [drp] | OPHTHALMIC | Status: DC | PRN

## 2017-10-10 MED ORDER — NA CHONDROIT SULF-NA HYALURON 40-17 MG/ML IO SOLN
INTRAOCULAR | Status: DC | PRN
  Administered 2017-10-10: 1 mL via INTRAOCULAR

## 2017-10-10 MED ORDER — DEXMEDETOMIDINE HCL IN NACL 200 MCG/50ML IV SOLN
INTRAVENOUS | Status: AC
  Filled 2017-10-10: qty 50

## 2017-10-10 MED ORDER — LIDOCAINE HCL (PF) 4 % IJ SOLN
INTRAMUSCULAR | Status: AC
  Filled 2017-10-10: qty 5

## 2017-10-10 MED ORDER — DEXMEDETOMIDINE HCL 200 MCG/2ML IV SOLN
INTRAVENOUS | Status: DC | PRN
  Administered 2017-10-10 (×2): 8 ug via INTRAVENOUS

## 2017-10-10 MED ORDER — CARBACHOL 0.01 % IO SOLN
INTRAOCULAR | Status: DC | PRN
  Administered 2017-10-10: .5 mL via INTRAOCULAR

## 2017-10-10 MED ORDER — FENTANYL CITRATE (PF) 100 MCG/2ML IJ SOLN
INTRAMUSCULAR | Status: DC | PRN
  Administered 2017-10-10 (×2): 50 ug via INTRAVENOUS

## 2017-10-10 MED ORDER — PHENYLEPHRINE HCL 10 % OP SOLN
1.0000 [drp] | OPHTHALMIC | Status: AC
  Administered 2017-10-10 (×4): 1 [drp] via OPHTHALMIC

## 2017-10-10 SURGICAL SUPPLY — 16 items
GLOVE BIO SURGEON STRL SZ8 (GLOVE) ×3 IMPLANT
GLOVE BIOGEL M 6.5 STRL (GLOVE) ×3 IMPLANT
GLOVE SURG LX 8.0 MICRO (GLOVE) ×2
GLOVE SURG LX STRL 8.0 MICRO (GLOVE) ×1 IMPLANT
GOWN STRL REUS W/ TWL LRG LVL3 (GOWN DISPOSABLE) ×2 IMPLANT
GOWN STRL REUS W/TWL LRG LVL3 (GOWN DISPOSABLE) ×6
LABEL CATARACT MEDS ST (LABEL) ×3 IMPLANT
LENS IOL TECNIS ITEC 20.5 (Intraocular Lens) ×2 IMPLANT
PACK CATARACT (MISCELLANEOUS) ×3 IMPLANT
PACK CATARACT BRASINGTON LX (MISCELLANEOUS) ×3 IMPLANT
PACK EYE AFTER SURG (MISCELLANEOUS) ×3 IMPLANT
SOL BSS BAG (MISCELLANEOUS) ×3
SOLUTION BSS BAG (MISCELLANEOUS) ×1 IMPLANT
SYR 5ML LL (SYRINGE) ×3 IMPLANT
WATER STERILE IRR 250ML POUR (IV SOLUTION) ×3 IMPLANT
WIPE NON LINTING 3.25X3.25 (MISCELLANEOUS) ×3 IMPLANT

## 2017-10-10 NOTE — Anesthesia Postprocedure Evaluation (Signed)
Anesthesia Post Note  Patient: Brooke Krueger  Procedure(s) Performed: CATARACT EXTRACTION PHACO AND INTRAOCULAR LENS PLACEMENT (IOC) (Right Eye)  Patient location during evaluation: Short Stay Anesthesia Type: MAC Level of consciousness: awake and alert, oriented and patient cooperative Pain management: satisfactory to patient Vital Signs Assessment: post-procedure vital signs reviewed and stable Respiratory status: spontaneous breathing and respiratory function stable Cardiovascular status: blood pressure returned to baseline and stable Postop Assessment: no headache, no backache, patient able to bend at knees, no apparent nausea or vomiting, adequate PO intake and able to ambulate Anesthetic complications: no     Last Vitals:  Vitals:   10/10/17 0934  BP: 139/80  Resp: 16  Temp: 36.4 C  SpO2: 98%    Last Pain:  Vitals:   10/10/17 0934  TempSrc: Tympanic  PainSc: 3                  Orin Eberwein H Maikel Neisler

## 2017-10-10 NOTE — Anesthesia Preprocedure Evaluation (Signed)
Anesthesia Evaluation  Patient identified by MRN, date of birth, ID band Patient awake    Reviewed: Allergy & Precautions, NPO status , Patient's Chart, lab work & pertinent test results  History of Anesthesia Complications Negative for: history of anesthetic complications  Airway Mallampati: III       Dental   Pulmonary sleep apnea and Continuous Positive Airway Pressure Ventilation , neg COPD, former smoker,           Cardiovascular hypertension, Pt. on medications and Pt. on home beta blockers (-) Past MI and (-) CHF + dysrhythmias Atrial Fibrillation + Valvular Problems/Murmurs      Neuro/Psych neg Seizures    GI/Hepatic Neg liver ROS, GERD  Medicated,  Endo/Other  neg diabetes  Renal/GU negative Renal ROS     Musculoskeletal   Abdominal   Peds  Hematology   Anesthesia Other Findings   Reproductive/Obstetrics                             Anesthesia Physical Anesthesia Plan  ASA: III  Anesthesia Plan: MAC   Post-op Pain Management:    Induction:   PONV Risk Score and Plan:   Airway Management Planned: Nasal Cannula  Additional Equipment:   Intra-op Plan:   Post-operative Plan:   Informed Consent: I have reviewed the patients History and Physical, chart, labs and discussed the procedure including the risks, benefits and alternatives for the proposed anesthesia with the patient or authorized representative who has indicated his/her understanding and acceptance.     Plan Discussed with:   Anesthesia Plan Comments:         Anesthesia Quick Evaluation

## 2017-10-10 NOTE — Anesthesia Post-op Follow-up Note (Signed)
Anesthesia QCDR form completed.        

## 2017-10-10 NOTE — Transfer of Care (Signed)
Immediate Anesthesia Transfer of Care Note  Patient: Brooke Krueger  Procedure(s) Performed: CATARACT EXTRACTION PHACO AND INTRAOCULAR LENS PLACEMENT (IOC) (Right Eye)  Patient Location: Short Stay  Anesthesia Type:MAC  Level of Consciousness: awake, alert , oriented and patient cooperative  Airway & Oxygen Therapy: Patient Spontanous Breathing  Post-op Assessment: Report given to RN, Post -op Vital signs reviewed and stable and Patient moving all extremities  Post vital signs: Reviewed and stable  Last Vitals:  Vitals Value Taken Time  BP    Temp    Pulse    Resp    SpO2      Last Pain:  Vitals:   10/10/17 0934  TempSrc: Tympanic  PainSc: 3          Complications: No apparent anesthesia complications

## 2017-10-10 NOTE — Discharge Instructions (Signed)
Eye Surgery Discharge Instructions    Expect mild scratchy sensation or mild soreness. DO NOT RUB YOUR EYE!  The day of surgery:  Minimal physical activity, but bed rest is not required  No reading, computer work, or close hand work  No bending, lifting, or straining.  May watch TV  For 24 hours:  No driving, legal decisions, or alcoholic beverages  Safety precautions  Eat anything you prefer: It is better to start with liquids, then soup then solid foods.  _____ Eye patch should be worn until postoperative exam tomorrow.  ____ Solar shield eyeglasses should be worn for comfort in the sunlight/patch while sleeping  Resume all regular medications including aspirin or Coumadin if these were discontinued prior to surgery. You may shower, bathe, shave, or wash your hair. Tylenol may be taken for mild discomfort.  Call your doctor if you experience significant pain, nausea, or vomiting, fever > 101 or other signs of infection. [phone] or [phone] Specific instructions:  Follow-up Information    Birder Robson, MD Follow up.   Specialty:  Ophthalmology Why:  August 28 at 8:10am Contact information: [address] [place] [address] [phone]

## 2017-10-10 NOTE — Op Note (Signed)
PREOPERATIVE DIAGNOSIS:  Nuclear sclerotic cataract of the right eye.   POSTOPERATIVE DIAGNOSIS:  nuclear sclerotic cataract right eye   OPERATIVE PROCEDURE: Procedure(s): CATARACT EXTRACTION PHACO AND INTRAOCULAR LENS PLACEMENT (IOC)   SURGEON:  Birder Robson, MD.   ANESTHESIA:  Anesthesiologist: Gunnar Fusi, MD CRNA: Nile Riggs, CRNA  1.      Managed anesthesia care. 2.      0.21ml of Shugarcaine was instilled in the eye following the paracentesis.   COMPLICATIONS:  None.   TECHNIQUE:   Stop and chop   DESCRIPTION OF PROCEDURE:  The patient was examined and consented in the preoperative holding area where the aforementioned topical anesthesia was applied to the right eye and then brought back to the Operating Room where the right eye was prepped and draped in the usual sterile ophthalmic fashion and a lid speculum was placed. A paracentesis was created with the side port blade and the anterior chamber was filled with viscoelastic. A near clear corneal incision was performed with the steel keratome. A continuous curvilinear capsulorrhexis was performed with a cystotome followed by the capsulorrhexis forceps. Hydrodissection and hydrodelineation were carried out with BSS on a blunt cannula. The lens was removed in a stop and chop  technique and the remaining cortical material was removed with the irrigation-aspiration handpiece. The capsular bag was inflated with viscoelastic and the Technis [id number]  lens was placed in the capsular bag without complication. The remaining viscoelastic was removed from the eye with the irrigation-aspiration handpiece. The wounds were hydrated. The anterior chamber was flushed with Miostat and the eye was inflated to physiologic pressure. 0.24ml of Vigamox was placed in the anterior chamber. The wounds were found to be water tight. The eye was dressed with Vigamox. The patient was given protective glasses to wear throughout the day and a shield with  which to sleep tonight. The patient was also given drops with which to begin a drop regimen today and will follow-up with me in one day. Implant Name Type Inv. Item Serial No. Manufacturer Lot No. LRB No. Used  LENS IOL DIOP 20.5 - [id number] Intraocular Lens LENS IOL DIOP 20.5 [id number]  Right 1   Procedure(s) with comments: CATARACT EXTRACTION PHACO AND INTRAOCULAR LENS PLACEMENT (IOC) (Right) - [place] 00:46 AP% 13.0 CDE 6.06 Fluid pack lot # [id number]  Electronically signed: Birder Robson 10/10/2017 10:54 AM

## 2017-10-10 NOTE — H&P (Signed)
All labs reviewed. Abnormal studies sent to patients PCP when indicated.  Previous H&P reviewed, patient examined, there are NO CHANGES.  Brooke Dolle Porfilio8/27/201910:28 AM

## 2017-12-13 ENCOUNTER — Encounter: Payer: Self-pay | Admitting: *Deleted

## 2017-12-19 ENCOUNTER — Ambulatory Visit: Payer: Medicare Other | Admitting: Anesthesiology

## 2017-12-19 ENCOUNTER — Other Ambulatory Visit: Payer: Self-pay

## 2017-12-19 ENCOUNTER — Ambulatory Visit
Admission: RE | Admit: 2017-12-19 | Discharge: 2017-12-19 | Disposition: A | Discharge status: Home / Self Care | Payer: Medicare Other | Source: Ambulatory Visit | Attending: Ophthalmology | Admitting: Ophthalmology

## 2017-12-19 ENCOUNTER — Encounter: Payer: Self-pay | Admitting: *Deleted

## 2017-12-19 ENCOUNTER — Encounter
Admission: RE | Disposition: A | Discharge status: Home / Self Care | Payer: Self-pay | Source: Ambulatory Visit | Attending: Ophthalmology

## 2017-12-19 DIAGNOSIS — H2512 Age-related nuclear cataract, left eye: Secondary | ICD-10-CM | POA: Insufficient documentation

## 2017-12-19 DIAGNOSIS — M1990 Unspecified osteoarthritis, unspecified site: Secondary | ICD-10-CM | POA: Diagnosis not present

## 2017-12-19 DIAGNOSIS — Z8542 Personal history of malignant neoplasm of other parts of uterus: Secondary | ICD-10-CM | POA: Diagnosis not present

## 2017-12-19 DIAGNOSIS — Z880 Allergy status to penicillin: Secondary | ICD-10-CM | POA: Diagnosis not present

## 2017-12-19 DIAGNOSIS — E785 Hyperlipidemia, unspecified: Secondary | ICD-10-CM | POA: Insufficient documentation

## 2017-12-19 DIAGNOSIS — Z888 Allergy status to other drugs, medicaments and biological substances status: Secondary | ICD-10-CM | POA: Insufficient documentation

## 2017-12-19 DIAGNOSIS — Z853 Personal history of malignant neoplasm of breast: Secondary | ICD-10-CM | POA: Insufficient documentation

## 2017-12-19 DIAGNOSIS — Z7901 Long term (current) use of anticoagulants: Secondary | ICD-10-CM | POA: Insufficient documentation

## 2017-12-19 DIAGNOSIS — Z9841 Cataract extraction status, right eye: Secondary | ICD-10-CM | POA: Insufficient documentation

## 2017-12-19 DIAGNOSIS — G4730 Sleep apnea, unspecified: Secondary | ICD-10-CM | POA: Insufficient documentation

## 2017-12-19 DIAGNOSIS — I4891 Unspecified atrial fibrillation: Secondary | ICD-10-CM | POA: Insufficient documentation

## 2017-12-19 DIAGNOSIS — Z886 Allergy status to analgesic agent status: Secondary | ICD-10-CM | POA: Insufficient documentation

## 2017-12-19 DIAGNOSIS — Z79899 Other long term (current) drug therapy: Secondary | ICD-10-CM | POA: Diagnosis not present

## 2017-12-19 DIAGNOSIS — Z923 Personal history of irradiation: Secondary | ICD-10-CM | POA: Insufficient documentation

## 2017-12-19 DIAGNOSIS — I10 Essential (primary) hypertension: Secondary | ICD-10-CM | POA: Diagnosis not present

## 2017-12-19 HISTORY — PX: CATARACT EXTRACTION W/PHACO: SHX586

## 2017-12-19 HISTORY — DX: Epistaxis: R04.0

## 2017-12-19 SURGERY — PHACOEMULSIFICATION, CATARACT, WITH IOL INSERTION
Anesthesia: Monitor Anesthesia Care | Site: Eye | Laterality: Left

## 2017-12-19 MED ORDER — MOXIFLOXACIN HCL 0.5 % OP SOLN: 1.0000 [drp] | OPHTHALMIC | Status: DC | PRN

## 2017-12-19 MED ORDER — LIDOCAINE HCL (PF) 4 % IJ SOLN
INTRAOCULAR | Status: DC | PRN
  Administered 2017-12-19: 2 mL via OPHTHALMIC

## 2017-12-19 MED ORDER — FENTANYL CITRATE (PF) 100 MCG/2ML IJ SOLN
INTRAMUSCULAR | Status: DC | PRN
  Administered 2017-12-19: 50 ug via INTRAVENOUS
  Administered 2017-12-19: 25 ug via INTRAVENOUS

## 2017-12-19 MED ORDER — POVIDONE-IODINE 5 % OP SOLN
OPHTHALMIC | Status: DC | PRN
  Administered 2017-12-19: 1 via OPHTHALMIC

## 2017-12-19 MED ORDER — EPINEPHRINE PF 1 MG/ML IJ SOLN
INTRAOCULAR | Status: DC | PRN
  Administered 2017-12-19: 1 mL via OPHTHALMIC

## 2017-12-19 MED ORDER — EPINEPHRINE PF 1 MG/ML IJ SOLN
INTRAMUSCULAR | Status: AC
  Filled 2017-12-19: qty 1

## 2017-12-19 MED ORDER — SODIUM CHLORIDE 0.9 % IV SOLN
INTRAVENOUS | Status: DC
  Administered 2017-12-19: 10:00:00 via INTRAVENOUS

## 2017-12-19 MED ORDER — TETRACAINE HCL 0.5 % OP SOLN: 1.0000 [drp] | OPHTHALMIC | Status: DC | PRN

## 2017-12-19 MED ORDER — FENTANYL CITRATE (PF) 100 MCG/2ML IJ SOLN
INTRAMUSCULAR | Status: AC
  Filled 2017-12-19: qty 2

## 2017-12-19 MED ORDER — TETRACAINE HCL 0.5 % OP SOLN
OPHTHALMIC | Status: AC
  Administered 2017-12-19: 1 [drp]
  Administered 2017-12-19: 10:00:00
  Filled 2017-12-19: qty 4

## 2017-12-19 MED ORDER — POVIDONE-IODINE 5 % OP SOLN
OPHTHALMIC | Status: AC
  Filled 2017-12-19: qty 30

## 2017-12-19 MED ORDER — NA CHONDROIT SULF-NA HYALURON 40-17 MG/ML IO SOLN
INTRAOCULAR | Status: DC | PRN
  Administered 2017-12-19: 1 mL via INTRAOCULAR

## 2017-12-19 MED ORDER — CARBACHOL 0.01 % IO SOLN
INTRAOCULAR | Status: DC | PRN
  Administered 2017-12-19: .5 mL via INTRAOCULAR

## 2017-12-19 MED ORDER — MOXIFLOXACIN HCL 0.5 % OP SOLN
OPHTHALMIC | Status: DC | PRN
  Administered 2017-12-19: .2 mL via OPHTHALMIC

## 2017-12-19 MED ORDER — NA CHONDROIT SULF-NA HYALURON 40-17 MG/ML IO SOLN
INTRAOCULAR | Status: AC
  Filled 2017-12-19: qty 1

## 2017-12-19 MED ORDER — LIDOCAINE HCL (PF) 4 % IJ SOLN
INTRAMUSCULAR | Status: AC
  Filled 2017-12-19: qty 5

## 2017-12-19 MED ORDER — MOXIFLOXACIN HCL 0.5 % OP SOLN
OPHTHALMIC | Status: AC
  Filled 2017-12-19: qty 3

## 2017-12-19 MED ORDER — ARMC OPHTHALMIC DILATING DROPS
1.0000 "application " | OPHTHALMIC | Status: DC
  Administered 2017-12-19: 1 via OPHTHALMIC

## 2017-12-19 MED ORDER — ARMC OPHTHALMIC DILATING DROPS
OPHTHALMIC | Status: AC
  Administered 2017-12-19: 1 via OPHTHALMIC
  Filled 2017-12-19: qty 0.5

## 2017-12-19 SURGICAL SUPPLY — 16 items
GLOVE BIO SURGEON STRL SZ8 (GLOVE) ×3 IMPLANT
GLOVE BIOGEL M 6.5 STRL (GLOVE) ×3 IMPLANT
GLOVE SURG LX 8.0 MICRO (GLOVE) ×2
GLOVE SURG LX STRL 8.0 MICRO (GLOVE) ×1 IMPLANT
GOWN STRL REUS W/ TWL LRG LVL3 (GOWN DISPOSABLE) ×2 IMPLANT
GOWN STRL REUS W/TWL LRG LVL3 (GOWN DISPOSABLE) ×6
LABEL CATARACT MEDS ST (LABEL) ×3 IMPLANT
LENS IOL TECNIS ITEC 20.5 (Intraocular Lens) ×2 IMPLANT
PACK CATARACT (MISCELLANEOUS) ×3 IMPLANT
PACK CATARACT BRASINGTON LX (MISCELLANEOUS) ×3 IMPLANT
PACK EYE AFTER SURG (MISCELLANEOUS) ×3 IMPLANT
SOL BSS BAG (MISCELLANEOUS) ×3
SOLUTION BSS BAG (MISCELLANEOUS) ×1 IMPLANT
SYR 5ML LL (SYRINGE) ×3 IMPLANT
WATER STERILE IRR 250ML POUR (IV SOLUTION) ×3 IMPLANT
WIPE NON LINTING 3.25X3.25 (MISCELLANEOUS) ×3 IMPLANT

## 2017-12-19 NOTE — Anesthesia Preprocedure Evaluation (Signed)
Anesthesia Evaluation  Patient identified by MRN, date of birth, ID band Patient awake    Reviewed: Allergy & Precautions, NPO status , Patient's Chart, lab work & pertinent test results  History of Anesthesia Complications Negative for: history of anesthetic complications  Airway Mallampati: II  TM Distance: >3 FB Neck ROM: Full    Dental no notable dental hx.    Pulmonary sleep apnea and Continuous Positive Airway Pressure Ventilation , neg COPD, former smoker,    breath sounds clear to auscultation- rhonchi (-) wheezing      Cardiovascular hypertension, Pt. 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 No date: GERD (gastroesophageal reflux disease) No date: Heart murmur No date: History of adenomatous polyp of colon No date: History of gout No date: History of kidney stones No date: Hyperlipemia No date: Hypertension No date: Hyperuricemia No date: Nose abnormality     Comment:  BLEEDS, CAUTERIZATION RIGHT SIDE 09/25/17 2002: Personal history of radiation therapy     Comment:   right breast ca No date: Sleep apnea     Comment:  cpap   Reproductive/Obstetrics                             Anesthesia Physical Anesthesia Plan  ASA: III  Anesthesia Plan: MAC   Post-op Pain Management:    Induction: Intravenous  PONV Risk Score and Plan: 2 and Midazolam  Airway Management Planned: Natural Airway  Additional Equipment:   Intra-op Plan:   Post-operative Plan:   Informed Consent: I have reviewed the patients History and Physical, chart, labs and discussed the procedure including the risks, benefits and alternatives for the proposed anesthesia with the patient or authorized representative who has indicated his/her understanding and acceptance.     Plan Discussed with: CRNA and Anesthesiologist  Anesthesia Plan Comments:         Anesthesia Quick Evaluation

## 2017-12-19 NOTE — Anesthesia Postprocedure Evaluation (Signed)
Anesthesia Post Note  Patient: Brooke Krueger  Procedure(s) Performed: CATARACT EXTRACTION PHACO AND INTRAOCULAR LENS PLACEMENT ([place]) (Left Eye)  Patient location during evaluation: Short Stay Anesthesia Type: MAC Level of consciousness: awake, awake and alert and oriented Pain management: pain level controlled Vital Signs Assessment: post-procedure vital signs reviewed and stable Respiratory status: spontaneous breathing Cardiovascular status: stable Postop Assessment: no headache and adequate PO intake Anesthetic complications: no     Last Vitals:  Vitals:   12/19/17 1027 12/19/17 1028  BP: 135/76 136/64  Pulse: 94 92  Resp: 18 16  Temp: 36.4 C   SpO2: 100% 99%    Last Pain:  Vitals:   12/19/17 1027  TempSrc: Temporal  PainSc: 0-No pain                 Lanora Manis

## 2017-12-19 NOTE — Discharge Instructions (Addendum)
Eye Surgery Discharge Instructions  Expect mild scratchy sensation or mild soreness. DO NOT RUB YOUR EYE!  The day of surgery:  Minimal physical activity, but bed rest is not required  No reading, computer work, or close hand work  No bending, lifting, or straining.  May watch TV  For 24 hours:  No driving, legal decisions, or alcoholic beverages  Safety precautions  Eat anything you prefer: It is better to start with liquids, then soup then solid foods.   Solar shield eyeglasses should be worn for comfort in the sunlight/patch while sleeping  Resume all regular medications including aspirin or Coumadin if these were discontinued prior to surgery. You may shower, bathe, shave, or wash your hair. Tylenol may be taken for mild discomfort. FOLLOW DR. PORFILIO'S EYE DROP INSTRUCTION SHEET AS REVIEWED.  Call your doctor if you experience significant pain, nausea, or vomiting, fever > 101 or other signs of infection. [phone] or [phone] Specific instructions:  Follow-up Information    Birder Robson, MD Follow up.   Specialty:  Ophthalmology Why:  12/20/17 @ 11:00 am Contact information: [place] [address] [phone]

## 2017-12-19 NOTE — Anesthesia Post-op Follow-up Note (Signed)
Anesthesia QCDR form completed.        

## 2017-12-19 NOTE — Transfer of Care (Signed)
Immediate Anesthesia Transfer of Care Note  Patient: Brooke Krueger  Procedure(s) Performed: CATARACT EXTRACTION PHACO AND INTRAOCULAR LENS PLACEMENT ([place]) (Left Eye)  Patient Location: Short Stay  Anesthesia Type:MAC  Level of Consciousness: awake, alert  and oriented  Airway & Oxygen Therapy: Patient Spontanous Breathing  Post-op Assessment: Report given to RN and Post -op Vital signs reviewed and stable  Post vital signs: Reviewed and stable  Last Vitals:  Vitals Value Taken Time  BP 136/64 12/19/2017 10:28 AM  Temp    Pulse 92 12/19/2017 10:28 AM  Resp 16 12/19/2017 10:28 AM  SpO2 99 % 12/19/2017 10:28 AM    Last Pain:  Vitals:   12/19/17 0915  TempSrc: Tympanic  PainSc: 4          Complications: No apparent anesthesia complications

## 2017-12-19 NOTE — H&P (Signed)
All labs reviewed. Abnormal studies sent to patients PCP when indicated.  Previous H&P reviewed, patient examined, there are NO CHANGES.  Early Ord Porfilio11/5/201910:03 AM

## 2017-12-19 NOTE — Op Note (Signed)
PREOPERATIVE DIAGNOSIS:  Nuclear sclerotic cataract of the left eye.   POSTOPERATIVE DIAGNOSIS:  Nuclear sclerotic cataract of the left eye.   OPERATIVE PROCEDURE: Procedure(s): CATARACT EXTRACTION PHACO AND INTRAOCULAR LENS PLACEMENT ([facility])   SURGEON:  Birder Robson, MD.   ANESTHESIA:  Anesthesiologist: Emmie Niemann, MD CRNA: Jonna Clark, CRNA  1.      Managed anesthesia care. 2.     0.1ml of Shugarcaine was instilled following the paracentesis   COMPLICATIONS:  None.   TECHNIQUE:   Stop and chop   DESCRIPTION OF PROCEDURE:  The patient was examined and consented in the preoperative holding area where the aforementioned topical anesthesia was applied to the left eye and then brought back to the Operating Room where the left eye was prepped and draped in the usual sterile ophthalmic fashion and a lid speculum was placed. A paracentesis was created with the side port blade and the anterior chamber was filled with viscoelastic. A near clear corneal incision was performed with the steel keratome. A continuous curvilinear capsulorrhexis was performed with a cystotome followed by the capsulorrhexis forceps. Hydrodissection and hydrodelineation were carried out with BSS on a blunt cannula. The lens was removed in a stop and chop  technique and the remaining cortical material was removed with the irrigation-aspiration handpiece. The capsular bag was inflated with viscoelastic and the Technis [id number] lens was placed in the capsular bag without complication. The remaining viscoelastic was removed from the eye with the irrigation-aspiration handpiece. The wounds were hydrated. The anterior chamber was flushed with Miostat and the eye was inflated to physiologic pressure. 0.76ml Vigamox was placed in the anterior chamber. The wounds were found to be water tight. The eye was dressed with Vigamox. The patient was given protective glasses to wear throughout the day and a shield with which to sleep  tonight. The patient was also given drops with which to begin a drop regimen today and will follow-up with me in one day. Implant Name Type Inv. Item Serial No. Manufacturer Lot No. LRB No. Used  LENS IOL DIOP 20.5 - [id number] Intraocular Lens LENS IOL DIOP 20.5 [id number] 1910 AMO  Left 1    Procedure(s) with comments: CATARACT EXTRACTION PHACO AND INTRAOCULAR LENS PLACEMENT ([facility]) (Left) - [place]  00:41 CDE 6.66 Fluid pack lot # [id number]  Electronically signed: Birder Robson 12/19/2017 10:24 AM

## 2018-02-27 ENCOUNTER — Other Ambulatory Visit: Payer: Self-pay | Admitting: Internal Medicine

## 2018-02-27 DIAGNOSIS — Z1231 Encounter for screening mammogram for malignant neoplasm of breast: Secondary | ICD-10-CM

## 2018-03-15 ENCOUNTER — Ambulatory Visit
Admission: RE | Admit: 2018-03-15 | Discharge: 2018-03-15 | Disposition: A | Discharge status: Home / Self Care | Payer: Medicare Other | Source: Ambulatory Visit | Attending: Internal Medicine | Admitting: Internal Medicine

## 2018-03-15 DIAGNOSIS — Z1231 Encounter for screening mammogram for malignant neoplasm of breast: Secondary | ICD-10-CM | POA: Diagnosis not present

## 2018-11-29 NOTE — Progress Notes (Addendum)
PCP - [place] Cardiologist - Bartholome Bill  Clearance on chart 11-13-18   Chest x-ray -  EKG - 09-19-2018  On chart History of known a-fib Stress Test -  ECHO -  Cardiac Cath -   Sleep Study -  CPAP - yes  Fasting Blood Sugar -  Checks Blood Sugar _____ times a day  Blood Thinner Instructions:  Xarelto stop 3 days prior Aspirin Instructions: Last Dose:  Anesthesia review: Xarelto OSA cpap , pt 17.5  Patient denies shortness of breath, fever, cough and chest pain at PAT appointment   NONE   Patient verbalized understanding of instructions that were given to them at the PAT appointment. Patient was also instructed that they will need to review over the PAT instructions again at home before surgery. Note

## 2018-11-29 NOTE — Patient Instructions (Signed)
DUE TO COVID-19 ONLY ONE VISITOR IS ALLOWED TO COME WITH YOU AND STAY IN THE WAITING ROOM ONLY DURING PRE OP AND PROCEDURE DAY OF SURGERY. THE 1 VISITOR MAY VISIT WITH YOU AFTER SURGERY IN YOUR PRIVATE ROOM DURING VISITING HOURS ONLY!  YOU NEED TO HAVE A COVID 19 TEST ON_______ @_______ , THIS TEST MUST BE DONE BEFORE SURGERY, COME  [place], [place] , [address].  ([place]) ONCE YOUR COVID TEST IS COMPLETED, PLEASE BEGIN THE QUARANTINE INSTRUCTIONS AS OUTLINED IN YOUR HANDOUT.                Brooke Krueger [facility]  11/29/2018   Your procedure is scheduled on: 12-10-18    Report to [facility] Main  Entrance   Report to admitting at      0800  AM     Call this number if you have problems the morning of surgery [phone]    Remember: NO SOLID FOOD AFTER MIDNIGHT THE NIGHT PRIOR TO SURGERY. NOTHING BY MOUTH EXCEPT CLEAR LIQUIDS UNTIL  0730 am  . PLEASE FINISH ENSURE DRINK PER SURGEON ORDER  WHICH NEEDS TO BE COMPLETED AT     0730 am then nothing by mouth .    CLEAR LIQUID DIET   Foods Allowed                                                                     Foods Excluded  Coffee and tea, regular and decaf                             liquids that you cannot  Plain Jell-O any favor except red or purple                                           see through such as: Fruit ices (not with fruit pulp)                                     milk, soups, orange juice  Iced Popsicles                                    All solid food Carbonated beverages, regular and diet                                    Cranberry, grape and apple juices Sports drinks like Gatorade Lightly seasoned clear broth or consume(fat free) Sugar, honey syrup  _____________________________________________________________________   BRUSH YOUR TEETH MORNING OF SURGERY AND RINSE YOUR MOUTH OUT, NO CHEWING GUM CANDY OR MINTS.     Take these medicines the morning of surgery with A  SIP OF WATER: oxybutin, metoprolol, tylenol if needed  DO NOT TAKE ANY DIABETIC MEDICATIONS DAY OF YOUR SURGERY  You may not have any metal on your body including hair pins and              piercings  Do not wear jewelry, make-up, lotions, powders or perfumes, deodorant             Do not wear nail polish on your fingernails.  Do not shave  48 hours prior to surgery.     Do not bring valuables to the hospital. [place].  Contacts, dentures or bridgework may not be worn into surgery.                 Please read over the following fact sheets you were given: _____________________________________________________________________           [facility] - Preparing for Surgery Before surgery, you can play an important role.  Because skin is not sterile, your skin needs to be as free of germs as possible.  You can reduce the number of germs on your skin by washing with CHG (chlorahexidine gluconate) soap before surgery.  CHG is an antiseptic cleaner which kills germs and bonds with the skin to continue killing germs even after washing. Please DO NOT use if you have an allergy to CHG or antibacterial soaps.  If your skin becomes reddened/irritated stop using the CHG and inform your nurse when you arrive at Short Stay. Do not shave (including legs and underarms) for at least 48 hours prior to the first CHG shower.  You may shave your face/neck. Please follow these instructions carefully:  1.  Shower with CHG Soap the night before surgery and the  morning of Surgery.  2.  If you choose to wash your hair, wash your hair first as usual with your  normal  shampoo.  3.  After you shampoo, rinse your hair and body thoroughly to remove the  shampoo.                           4.  Use CHG as you would any other liquid soap.  You can apply chg directly  to the skin and wash                       Gently with a scrungie or clean  washcloth.  5.  Apply the CHG Soap to your body ONLY FROM THE NECK DOWN.   Do not use on face/ open                           Wound or open sores. Avoid contact with eyes, ears mouth and genitals (private parts).                       Wash face,  Genitals (private parts) with your normal soap.             6.  Wash thoroughly, paying special attention to the area where your surgery  will be performed.  7.  Thoroughly rinse your body with warm water from the neck down.  8.  DO NOT shower/wash with your normal soap after using and rinsing off  the CHG Soap.                9.  Pat yourself dry with a clean towel.  10.  Wear clean pajamas.            11.  Place clean sheets on your bed the night of your first shower and do not  sleep with pets. Day of Surgery : Do not apply any lotions/deodorants the morning of surgery.  Please wear clean clothes to the hospital/surgery center.  FAILURE TO FOLLOW THESE INSTRUCTIONS MAY RESULT IN THE CANCELLATION OF YOUR SURGERY PATIENT SIGNATURE_________________________________  NURSE SIGNATURE__________________________________  ________________________________________________________________________   Brooke Krueger  An incentive spirometer is a tool that can help keep your lungs clear and active. This tool measures how well you are filling your lungs with each breath. Taking long deep breaths may help reverse or decrease the chance of developing breathing (pulmonary) problems (especially infection) following:  A long period of time when you are unable to move or be active. BEFORE THE PROCEDURE   If the spirometer includes an indicator to show your best effort, your nurse or respiratory therapist will set it to a desired goal.  If possible, sit up straight or lean slightly forward. Try not to slouch.  Hold the incentive spirometer in an upright position. INSTRUCTIONS FOR USE  1. Sit on the edge of your bed if possible, or sit up as far  as you can in bed or on a chair. 2. Hold the incentive spirometer in an upright position. 3. Breathe out normally. 4. Place the mouthpiece in your mouth and seal your lips tightly around it. 5. Breathe in slowly and as deeply as possible, raising the piston or the ball toward the top of the column. 6. Hold your breath for 3-5 seconds or for as long as possible. Allow the piston or ball to fall to the bottom of the column. 7. Remove the mouthpiece from your mouth and breathe out normally. 8. Rest for a few seconds and repeat Steps 1 through 7 at least 10 times every 1-2 hours when you are awake. Take your time and take a few normal breaths between deep breaths. 9. The spirometer may include an indicator to show your best effort. Use the indicator as a goal to work toward during each repetition. 10. After each set of 10 deep breaths, practice coughing to be sure your lungs are clear. If you have an incision (the cut made at the time of surgery), support your incision when coughing by placing a pillow or rolled up towels firmly against it. Once you are able to get out of bed, walk around indoors and cough well. You may stop using the incentive spirometer when instructed by your caregiver.  RISKS AND COMPLICATIONS  Take your time so you do not get dizzy or light-headed.  If you are in pain, you may need to take or ask for pain medication before doing incentive spirometry. It is harder to take a deep breath if you are having pain. AFTER USE  Rest and breathe slowly and easily.  It can be helpful to keep track of a log of your progress. Your caregiver can provide you with a simple table to help with this. If you are using the spirometer at home, follow these instructions: [place] IF:   You are having difficultly using the spirometer.  You have trouble using the spirometer as often as instructed.  Your pain medication is not giving enough relief while using the spirometer.  You  develop fever of 100.5 F (38.1 C) or higher. SEEK IMMEDIATE MEDICAL CARE IF:   You cough up bloody  sputum that had not been present before.  You develop fever of 102 F (38.9 C) or greater.  You develop worsening pain at or near the incision site. MAKE SURE YOU:   Understand these instructions.  Will watch your condition.  Will get help right away if you are not doing well or get worse. Document Released: 06/13/2006 Document Revised: 04/25/2011 Document Reviewed: 08/14/2006 ExitCare Patient Information 2014 Exit[employer], [place].   ________________________________________________________________________  WHAT IS A BLOOD TRANSFUSION? Blood Transfusion Information  A transfusion is the replacement of blood or some of its parts. Blood is made up of multiple cells which provide different functions.  Red blood cells carry oxygen and are used for blood loss replacement.  White blood cells fight against infection.  Platelets control bleeding.  Plasma helps clot blood.  Other blood products are available for specialized needs, such as hemophilia or other clotting disorders. BEFORE THE TRANSFUSION  Who gives blood for transfusions?   Healthy volunteers who are fully evaluated to make sure their blood is safe. This is blood bank blood. Transfusion therapy is the safest it has ever been in the practice of medicine. Before blood is taken from a donor, a complete history is taken to make sure that person has no history of diseases nor engages in risky social behavior (examples are intravenous drug use or sexual activity with multiple partners). The donor's travel history is screened to minimize risk of transmitting infections, such as malaria. The donated blood is tested for signs of infectious diseases, such as HIV and hepatitis. The blood is then tested to be sure it is compatible with you in order to minimize the chance of a transfusion reaction. If you or a relative donates blood, this is  often done in anticipation of surgery and is not appropriate for emergency situations. It takes many days to process the donated blood. RISKS AND COMPLICATIONS Although transfusion therapy is very safe and saves many lives, the main dangers of transfusion include:   Getting an infectious disease.  Developing a transfusion reaction. This is an allergic reaction to something in the blood you were given. Every precaution is taken to prevent this. The decision to have a blood transfusion has been considered carefully by your caregiver before blood is given. Blood is not given unless the benefits outweigh the risks. AFTER THE TRANSFUSION  Right after receiving a blood transfusion, you will usually feel much better and more energetic. This is especially true if your red blood cells have gotten low (anemic). The transfusion raises the level of the red blood cells which carry oxygen, and this usually causes an energy increase.  The nurse administering the transfusion will monitor you carefully for complications. HOME CARE INSTRUCTIONS  No special instructions are needed after a transfusion. You may find your energy is better. Speak with your caregiver about any limitations on activity for underlying diseases you may have. SEEK MEDICAL CARE IF:   Your condition is not improving after your transfusion.  You develop redness or irritation at the intravenous (IV) site. SEEK IMMEDIATE MEDICAL CARE IF:  Any of the following symptoms occur over the next 12 hours:  Shaking chills.  You have a temperature by mouth above 102 F (38.9 C), not controlled by medicine.  Chest, back, or muscle pain.  People around you feel you are not acting correctly or are confused.  Shortness of breath or difficulty breathing.  Dizziness and fainting.  You get a rash or develop hives.  You have a decrease  in urine output.  Your urine turns a dark color or changes to pink, red, or brown. Any of the following  symptoms occur over the next 10 days:  You have a temperature by mouth above 102 F (38.9 C), not controlled by medicine.  Shortness of breath.  Weakness after normal activity.  The white part of the eye turns yellow (jaundice).  You have a decrease in the amount of urine or are urinating less often.  Your urine turns a dark color or changes to pink, red, or brown. Document Released: 01/29/2000 Document Revised: 04/25/2011 Document Reviewed: 09/17/2007 [facility] Patient Information 2014 [place], [place].  _______________________________________________________________________

## 2018-12-04 ENCOUNTER — Encounter (HOSPITAL_COMMUNITY): Payer: Self-pay

## 2018-12-04 ENCOUNTER — Encounter (HOSPITAL_COMMUNITY)
Admission: RE | Admit: 2018-12-04 | Discharge: 2018-12-04 | Disposition: A | Discharge status: Home / Self Care | Payer: Medicare Other | Source: Ambulatory Visit | Attending: Orthopedic Surgery | Admitting: Orthopedic Surgery

## 2018-12-04 ENCOUNTER — Other Ambulatory Visit: Payer: Self-pay

## 2018-12-04 DIAGNOSIS — M1990 Unspecified osteoarthritis, unspecified site: Secondary | ICD-10-CM | POA: Insufficient documentation

## 2018-12-04 DIAGNOSIS — E785 Hyperlipidemia, unspecified: Secondary | ICD-10-CM | POA: Diagnosis not present

## 2018-12-04 DIAGNOSIS — G4730 Sleep apnea, unspecified: Secondary | ICD-10-CM | POA: Diagnosis not present

## 2018-12-04 DIAGNOSIS — E119 Type 2 diabetes mellitus without complications: Secondary | ICD-10-CM | POA: Diagnosis not present

## 2018-12-04 DIAGNOSIS — I10 Essential (primary) hypertension: Secondary | ICD-10-CM | POA: Diagnosis not present

## 2018-12-04 DIAGNOSIS — Z7901 Long term (current) use of anticoagulants: Secondary | ICD-10-CM | POA: Insufficient documentation

## 2018-12-04 DIAGNOSIS — Z01818 Encounter for other preprocedural examination: Secondary | ICD-10-CM | POA: Insufficient documentation

## 2018-12-04 DIAGNOSIS — M1711 Unilateral primary osteoarthritis, right knee: Secondary | ICD-10-CM | POA: Diagnosis not present

## 2018-12-04 DIAGNOSIS — Z79899 Other long term (current) drug therapy: Secondary | ICD-10-CM | POA: Diagnosis not present

## 2018-12-04 HISTORY — DX: Type 2 diabetes mellitus without complications: E11.9

## 2018-12-04 LAB — COMPREHENSIVE METABOLIC PANEL
ALT: 17 U/L (ref 0–44)
AST: 20 U/L (ref 15–41)
Albumin: 4.3 g/dL (ref 3.5–5.0)
Alkaline Phosphatase: 85 U/L (ref 38–126)
Anion gap: 13 (ref 5–15)
BUN: 15 mg/dL (ref 8–23)
CO2: 26 mmol/L (ref 22–32)
Calcium: 9.9 mg/dL (ref 8.9–10.3)
Chloride: 99 mmol/L (ref 98–111)
Creatinine, Ser: 0.56 mg/dL (ref 0.44–1.00)
GFR calc Af Amer: >60 mL/min (ref >60)
GFR calc non Af Amer: >60 mL/min (ref >60)
Glucose, Bld: 94 mg/dL (ref 70–99)
Potassium: 3.4 mmol/L — ABNORMAL LOW (ref 3.5–5.1)
Sodium: 138 mmol/L (ref 135–145)
Total Bilirubin: 1 mg/dL (ref 0.3–1.2)
Total Protein: 7.6 g/dL (ref 6.5–8.1)

## 2018-12-04 LAB — CBC WITH DIFFERENTIAL/PLATELET
Abs Immature Granulocytes: 0.02 10*3/uL (ref 0.00–0.07)
Basophils Absolute: 0 10*3/uL (ref 0.0–0.1)
Basophils Relative: 0 %
Eosinophils Absolute: 0.1 10*3/uL (ref 0.0–0.5)
Eosinophils Relative: 2 %
HCT: 43.5 % (ref 36.0–46.0)
Hemoglobin: 13.5 g/dL (ref 12.0–15.0)
Immature Granulocytes: 0 %
Lymphocytes Relative: 27 %
Lymphs Abs: 2.2 10*3/uL (ref 0.7–4.0)
MCH: 27.9 pg (ref 26.0–34.0)
MCHC: 31 g/dL (ref 30.0–36.0)
MCV: 89.9 fL (ref 80.0–100.0)
Monocytes Absolute: 0.6 10*3/uL (ref 0.1–1.0)
Monocytes Relative: 7 %
Neutro Abs: 5 10*3/uL (ref 1.7–7.7)
Neutrophils Relative %: 64 %
Platelets: 198 10*3/uL (ref 150–400)
RBC: 4.84 MIL/uL (ref 3.87–5.11)
RDW: 14.8 % (ref 11.5–15.5)
WBC: 7.9 10*3/uL (ref 4.0–10.5)
nRBC: 0 % (ref 0.0–0.2)

## 2018-12-04 LAB — HEMOGLOBIN A1C
Hgb A1c MFr Bld: 5.8 % — ABNORMAL HIGH (ref 4.8–5.6)
Mean Plasma Glucose: 119.76 mg/dL

## 2018-12-04 LAB — PROTIME-INR
INR: 1.5 — ABNORMAL HIGH (ref 0.8–1.2)
Prothrombin Time: 17.5 seconds — ABNORMAL HIGH (ref 11.4–15.2)

## 2018-12-04 LAB — SURGICAL PCR SCREEN
MRSA, PCR: NEGATIVE
Staphylococcus aureus: NEGATIVE

## 2018-12-04 LAB — GLUCOSE, CAPILLARY: Glucose-Capillary: 84 mg/dL (ref 70–99)

## 2018-12-04 LAB — APTT: aPTT: 40 seconds — ABNORMAL HIGH (ref 24–36)

## 2018-12-06 ENCOUNTER — Other Ambulatory Visit (HOSPITAL_COMMUNITY)
Admission: RE | Admit: 2018-12-06 | Discharge: 2018-12-06 | Disposition: A | Discharge status: Home / Self Care | Payer: Medicare Other | Source: Ambulatory Visit | Attending: Orthopedic Surgery | Admitting: Orthopedic Surgery

## 2018-12-06 DIAGNOSIS — Z01812 Encounter for preprocedural laboratory examination: Secondary | ICD-10-CM | POA: Insufficient documentation

## 2018-12-06 DIAGNOSIS — Z20828 Contact with and (suspected) exposure to other viral communicable diseases: Secondary | ICD-10-CM | POA: Diagnosis not present

## 2018-12-06 NOTE — Anesthesia Preprocedure Evaluation (Addendum)
Anesthesia Evaluation  Patient identified by MRN, date of birth, ID band Patient awake    Reviewed: Allergy & Precautions, NPO status , Patient's Chart, lab work & pertinent test results, reviewed documented beta blocker date and time   Airway Mallampati: II  TM Distance: >3 FB Neck ROM: Full    Dental no notable dental hx. (+) Dental Advisory Given, Teeth Intact   Pulmonary neg pulmonary ROS, former smoker,    Pulmonary exam normal breath sounds clear to auscultation       Cardiovascular hypertension, Pt. on home beta blockers and Pt. on medications Normal cardiovascular exam+ dysrhythmias + Valvular Problems/Murmurs  Rhythm:Regular Rate:Normal     Neuro/Psych negative neurological ROS  negative psych ROS   GI/Hepatic Neg liver ROS, GERD  ,  Endo/Other  diabetes, Type 2Morbid obesity  Renal/GU negative Renal ROS  negative genitourinary   Musculoskeletal negative musculoskeletal ROS (+)   Abdominal   Peds negative pediatric ROS (+)  Hematology negative hematology ROS (+)   Anesthesia Other Findings   Reproductive/Obstetrics negative OB ROS                                                             Anesthesia Evaluation  Patient identified by MRN, date of birth, ID band Patient awake    Reviewed: Allergy & Precautions, NPO status , Patient's Chart, lab work & pertinent test results  History of Anesthesia Complications Negative for: history of anesthetic complications  Airway Mallampati: II  TM Distance: >3 FB Neck ROM: Full    Dental no notable dental hx.    Pulmonary sleep apnea and Continuous Positive Airway Pressure Ventilation , neg COPD, former smoker,    breath sounds clear to auscultation- rhonchi (-) wheezing      Cardiovascular hypertension, Pt. on medications (-) CAD, (-) Past MI, (-) Cardiac Stents and (-) CABG + dysrhythmias Atrial Fibrillation   Rhythm:Irregular Rate:Normal - Systolic murmurs and - Diastolic murmurs    Neuro/Psych negative neurological ROS  negative psych ROS   GI/Hepatic Neg liver ROS, GERD  ,  Endo/Other  negative endocrine ROSneg diabetes  Renal/GU negative Renal ROS     Musculoskeletal  (+) Arthritis ,   Abdominal (+) + obese,   Peds  Hematology negative hematology ROS (+)   Anesthesia Other Findings Past Medical History: No date: Abdominal fibromatosis No date: Arthritis No date: Atrial fibrillation ([place]) 2002: Breast cancer ([place])     Comment:  right lumpectomy No date: Chronic constipation No date: Diverticulosis No date: Dyspnea     Comment:  with excertion No date: Dysrhythmia     Comment:  AFIB 2013: Endometrial cancer ([place]) No date: Epistaxis     Comment:  FOR CAUTERY IN NOV. No date: GERD (gastroesophageal reflux disease) No date: Heart murmur No date: History of adenomatous polyp of colon No date: History of gout No date: History of kidney stones No date: Hyperlipemia No date: Hypertension No date: Hyperuricemia No date: Nose abnormality     Comment:  BLEEDS, CAUTERIZATION RIGHT SIDE 09/25/17 2002: Personal history of radiation therapy     Comment:  right breast ca No date: Sleep apnea     Comment:  cpap   Reproductive/Obstetrics  Anesthesia Physical Anesthesia Plan  ASA: III  Anesthesia Plan: MAC   Post-op Pain Management:    Induction: Intravenous  PONV Risk Score and Plan: 2 and Midazolam  Airway Management Planned: Natural Airway  Additional Equipment:   Intra-op Plan:   Post-operative Plan:   Informed Consent: I have reviewed the patients History and Physical, chart, labs and discussed the procedure including the risks, benefits and alternatives for the proposed anesthesia with the patient or authorized representative who has indicated his/her understanding and acceptance.     Plan Discussed  with: CRNA and Anesthesiologist  Anesthesia Plan Comments:         Anesthesia Quick Evaluation  Anesthesia Physical Anesthesia Plan  ASA: III  Anesthesia Plan: Spinal   Post-op Pain Management:  Regional for Post-op pain   Induction: Intravenous  PONV Risk Score and Plan: 3 and Ondansetron, Dexamethasone and Treatment may vary due to age or medical condition  Airway Management Planned: Natural Airway  Additional Equipment:   Intra-op Plan:   Post-operative Plan:   Informed Consent: I have reviewed the patients History and Physical, chart, labs and discussed the procedure including the risks, benefits and alternatives for the proposed anesthesia with the patient or authorized representative who has indicated his/her understanding and acceptance.     Dental advisory given  Plan Discussed with: CRNA  Anesthesia Plan Comments: (See APP note by Durel Salts, FNP)       Anesthesia Quick Evaluation

## 2018-12-06 NOTE — Progress Notes (Signed)
Anesthesia Chart Review:   Case: Brooke Krueger Date/Time: 12/10/18 1015   Procedure: TOTAL KNEE ARTHROPLASTY (Right Knee) - 80min   Anesthesia type: Choice   Pre-op diagnosis: right knee osteoarthritis   [facility]: [facility]OM 10 / [facility]   Surgeon: Gaynelle Arabian, MD      DISCUSSION:  - Pt is an 78 year old female with hx atrial fibrillation, HTN, OSA. Chart review shows pt with hx diet-controlled DM; pt reported at pre-admission testing she was unaware of this dx.    - Pt has cardiac clearance for surgery.   - Pt to hold xarelto 3 days before surgery  - PT 17.5, PTT 40.  Will recheck day of surgery.    VS: BP 138/86 (BP Location: Right Arm)   Pulse 85   Temp 37.1 C (Oral)   Resp 18   Ht 5\' 2"  (1.575 m)   Wt 98.1 kg   SpO2 99%   BMI 39.55 kg/m   PROVIDERS: - PCP is Leonel Ramsay, MD - Cardiologist is Bartholome Bill, MD; last office visit 09/19/18 (notes in care everywhere). Dr. Ubaldo Glassing cleared pt for surgery   LABS:  - PT 17.5, PTT 40.  Will recheck day of surgery.   (all labs ordered are listed, but only abnormal results are displayed)  Labs Reviewed  APTT - Abnormal; Notable for the following components:      Result Value   aPTT 40 (*)    All other components within normal limits  COMPREHENSIVE METABOLIC PANEL - Abnormal; Notable for the following components:   Potassium 3.4 (*)    All other components within normal limits  PROTIME-INR - Abnormal; Notable for the following components:   Prothrombin Time 17.5 (*)    INR 1.5 (*)    All other components within normal limits  HEMOGLOBIN A1C - Abnormal; Notable for the following components:   Hgb A1c MFr Bld 5.8 (*)    All other components within normal limits  SURGICAL PCR SCREEN  CBC WITH DIFFERENTIAL/PLATELET  GLUCOSE, CAPILLARY     EKG 09/19/18 (Dr. Bethanne Ginger office):  Atrial fibrillation. LAD. Low voltage QRS   CV:  Nuclear stress test 07/06/17 (care everywhere):  - Negative Lexiscan stress. - LV function  normal with an EF of 62%. - Fixed anterior defect which was small and mild and low risk for ischemia.    Past Medical History:  Diagnosis Date  . Abdominal fibromatosis   . Arthritis   . Atrial fibrillation ([place])   . Breast cancer ([place]) 2002   right lumpectomy  . Chronic constipation   . Diabetes mellitus without complication ([place])    no meds pt. denies ever being tols this  . Diverticulosis   . Dysrhythmia    AFIB  . Endometrial cancer ([place]) 2013  . Epistaxis    FOR CAUTERY IN NOV.  Marland Kitchen GERD (gastroesophageal reflux disease)   . Heart murmur    pt. denies   . History of adenomatous polyp of colon   . History of kidney stones   . Hyperlipemia   . Hypertension   . Hyperuricemia   . Nose abnormality    BLEEDS, CAUTERIZATION RIGHT SIDE 09/25/17  . Personal history of radiation therapy 2002   right breast ca  . Sleep apnea    cpap    Past Surgical History:  Procedure Laterality Date  . ABDOMINAL HYSTERECTOMY    . APPENDECTOMY    . BREAST BIOPSY Right 2002   positive  . BREAST LUMPECTOMY  Right 2002   radiation only  . CATARACT EXTRACTION W/PHACO Right 10/10/2017   Procedure: CATARACT EXTRACTION PHACO AND INTRAOCULAR LENS PLACEMENT (IOC);  Surgeon: Birder Robson, MD;  Location: [facility] ORS;  Service: Ophthalmology;  Laterality: Right;  [place] 00:46 AP% 13.0 CDE 6.06 Fluid pack lot # [id number]  . CATARACT EXTRACTION W/PHACO Left 12/19/2017   Procedure: CATARACT EXTRACTION PHACO AND INTRAOCULAR LENS PLACEMENT (IOC);  Surgeon: Birder Robson, MD;  Location: [facility] ORS;  Service: Ophthalmology;  Laterality: Left;  [place]  00:41 CDE 6.66 Fluid pack lot # [id number]  . COLONOSCOPY    . COLONOSCOPY WITH PROPOFOL N/A 07/07/2017   Procedure: COLONOSCOPY WITH PROPOFOL;  Surgeon: Manya Silvas, MD;  Location: [facility] ENDOSCOPY;  Service: Endoscopy;  Laterality: N/A;  . OOPHORECTOMY    . THORACOSCOPY  1988   hematoma  . TONSILLECTOMY      MEDICATIONS: . acetaminophen (TYLENOL) 500  MG tablet  . atorvastatin (LIPITOR) 20 MG tablet  . calcium-vitamin D (OSCAL WITH D) 500-200 MG-UNIT tablet  . Cranberry 500 MG TABS  . [employer] D3 2000 units CAPS  . docusate sodium (COLACE) 100 MG capsule  . hydrochlorothiazide (HYDRODIURIL) 25 MG tablet  . LUMIGAN 0.01 % SOLN  . metoprolol succinate (TOPROL-XL) 50 MG 24 hr tablet  . oxybutynin (DITROPAN XL) 15 MG 24 hr tablet  . traMADol (ULTRAM) 50 MG tablet  . XARELTO 20 MG TABS tablet   No current facility-administered medications for this encounter.    - Pt to hold xarelto 3 days before surgery   If labs acceptable day of surgery, I anticipate pt can proceed with surgery as scheduled.   Willeen Cass, FNP-BC [facility]/Anesthesiology Phone: [phone] 12/06/2018 11:33 AM

## 2018-12-08 LAB — NOVEL CORONAVIRUS, NAA (HOSP ORDER, SEND-OUT TO REF LAB; TAT 18-24 HRS): SARS-CoV-2, NAA: NOT DETECTED

## 2018-12-09 NOTE — H&P (Signed)
TOTAL KNEE ADMISSION H&P  Patient is being admitted for right total knee arthroplasty.  Subjective:  Chief Complaint:right knee pain.  HPI: Brooke Krueger, 78 y.o. female, has a history of pain and functional disability in the right knee due to arthritis and has failed non-surgical conservative treatments for greater than 12 weeks to includecorticosteriod injections, viscosupplementation injections and activity modification.  Onset of symptoms was gradual, starting 3 years ago with gradually worsening course since that time. The patient noted no past surgery on the right knee(s).  Patient currently rates pain in the right knee(s) at 7 out of 10 with activity. Patient has worsening of pain with activity and weight bearing and pain that interferes with activities of daily living.  Patient has evidence of severe medial and patellofemoral bone-on-bone arthritis with varus deformity bilaterally. by imaging studies. There is no active infection.  Patient Active Problem List   Diagnosis Date Noted  . Chronic hyperglycemia 06/20/2014  . Vitamin D deficiency 06/20/2014  . Chronic constipation 04/11/2014  . H/O adenomatous polyp of colon 04/11/2014  . Atrial fibrillation ([place]) 07/18/2013  . Benign essential hypertension 05/16/2013  . Hyperlipidemia, unspecified 05/16/2013  . OSA on CPAP 05/16/2013   Past Medical History:  Diagnosis Date  . Abdominal fibromatosis   . Arthritis   . Atrial fibrillation ([place])   . Breast cancer ([place]) 2002   right lumpectomy  . Chronic constipation   . Diabetes mellitus without complication ([place])    no meds pt. denies ever being tols this  . Diverticulosis   . Dysrhythmia    AFIB  . Endometrial cancer ([place]) 2013  . Epistaxis    FOR CAUTERY IN NOV.  Marland Kitchen GERD (gastroesophageal reflux disease)   . Heart murmur    pt. denies   . History of adenomatous polyp of colon   . History of kidney stones   . Hyperlipemia   . Hypertension   . Hyperuricemia   . Nose  abnormality    BLEEDS, CAUTERIZATION RIGHT SIDE 09/25/17  . Personal history of radiation therapy 2002   right breast ca  . Sleep apnea    cpap    Past Surgical History:  Procedure Laterality Date  . ABDOMINAL HYSTERECTOMY    . APPENDECTOMY    . BREAST BIOPSY Right 2002   positive  . BREAST LUMPECTOMY Right 2002   radiation only  . CATARACT EXTRACTION W/PHACO Right 10/10/2017   Procedure: CATARACT EXTRACTION PHACO AND INTRAOCULAR LENS PLACEMENT (IOC);  Surgeon: Birder Robson, MD;  Location: [facility] ORS;  Service: Ophthalmology;  Laterality: Right;  [place] 00:46 AP% 13.0 CDE 6.06 Fluid pack lot # [id number]  . CATARACT EXTRACTION W/PHACO Left 12/19/2017   Procedure: CATARACT EXTRACTION PHACO AND INTRAOCULAR LENS PLACEMENT (IOC);  Surgeon: Birder Robson, MD;  Location: [facility] ORS;  Service: Ophthalmology;  Laterality: Left;  [place]  00:41 CDE 6.66 Fluid pack lot # [id number]  . COLONOSCOPY    . COLONOSCOPY WITH PROPOFOL N/A 07/07/2017   Procedure: COLONOSCOPY WITH PROPOFOL;  Surgeon: Manya Silvas, MD;  Location: [facility] ENDOSCOPY;  Service: Endoscopy;  Laterality: N/A;  . OOPHORECTOMY    . THORACOSCOPY  1988   hematoma  . TONSILLECTOMY      No current facility-administered medications for this encounter.    Current Outpatient Medications  Medication Sig Dispense Refill Last Dose  . acetaminophen (TYLENOL) 500 MG tablet Take 1,000 mg by mouth every 6 (six) hours as needed (pain/headaches.).      Marland Kitchen atorvastatin (LIPITOR) 20 MG  tablet Take 20 mg by mouth every evening.      . calcium-vitamin D (OSCAL WITH D) 500-200 MG-UNIT tablet Take 1 tablet by mouth daily with breakfast.      . Cranberry 500 MG TABS Take 500 mg by mouth daily.      . CVS D3 2000 units CAPS Take 2,000 Units by mouth daily.   0   . docusate sodium (COLACE) 100 MG capsule Take 100 mg by mouth See admin instructions. Take 1 capsule (100 mg) by mouth scheduled in the morning, may take an additional dose in the evening if  needed for constipation.     . hydrochlorothiazide (HYDRODIURIL) 25 MG tablet Take 25 mg by mouth daily.      Marland Kitchen LUMIGAN 0.01 % SOLN Place 1 drop into both eyes at bedtime.      . metoprolol succinate (TOPROL-XL) 50 MG 24 hr tablet Take 50 mg by mouth daily.      Marland Kitchen oxybutynin (DITROPAN XL) 15 MG 24 hr tablet Take 15 mg by mouth daily.      . traMADol (ULTRAM) 50 MG tablet Take 50 mg by mouth every 6 (six) hours as needed (for knee pain.).   0   . XARELTO 20 MG TABS tablet Take 20 mg by mouth daily with supper.       Allergies  Allergen Reactions  . Aspirin Anaphylaxis and Other (See Comments)    Closes throat  . Combigan [Brimonidine Tartrate-Timolol] Other (See Comments)    respiratory distress  . Penicillins Rash and Other (See Comments)    Has patient had a PCN reaction causing immediate rash, facial/tongue/throat swelling, SOB or lightheadedness with hypotension: yes Has patient had a PCN reaction causing severe rash involving mucus membranes or skin necrosis: no Has patient had a PCN reaction that required hospitalization: no Has patient had a PCN reaction occurring within the last 10 years: no If all of the above answers are "NO", then may proceed with Cephalosporin use.     Social History   Tobacco Use  . Smoking status: Former Smoker    Years: 20.00    Quit date: 07/19/1977    Years since quitting: 41.4  . Smokeless tobacco: Never Used  Substance Use Topics  . Alcohol use: Not Currently    Alcohol/week: 3.0 standard drinks    Types: 1 Glasses of wine, 1 Cans of beer, 1 Shots of liquor per week    Comment: social    Family History  Problem Relation Age of Onset  . Breast cancer Mother 61  . Aortic stenosis Mother   . Dementia Mother   . Colon polyps Mother   . Breast cancer Sister 9  . Breast cancer Other   . Pancreatic cancer Father   . Pancreatic cancer Brother   . Bladder Cancer Neg Hx   . Prostate cancer Neg Hx   . Kidney cancer Neg Hx      Review of  Systems  Constitutional: Negative for chills and fever.  Respiratory: Negative for cough and shortness of breath.   Cardiovascular: Negative for chest pain and palpitations.  Gastrointestinal: Negative for nausea and vomiting.  Musculoskeletal: Positive for joint pain.    Objective:  Physical Exam Patient is an 78 year old female.  Well nourished and well developed. General: Alert and oriented x3, cooperative and pleasant, no acute distress. Head: normocephalic, atraumatic, neck supple. Eyes: EOMI. Respiratory: breath sounds clear in all fields, no wheezing, rales, or rhonchi. Cardiovascular: Regular rate and rhythm,  no murmurs, gallops or rubs. Abdomen: non-tender to palpation and soft, normoactive bowel sounds.  Musculoskeletal: Right Knee Exam: No effusion. Range of motion is 5-120 degrees. Marked crepitus on range of motion of the knee. Positive medial joint line tenderness No lateral joint line tenderness. Stable knee.  Left Knee Exam: No effusion. Range of motion is 5-120 degrees. Marked crepitus on range of motion of the knee. Positive medial joint line tenderness No lateral joint line tenderness. Stable knee.  Calves soft and nontender. Motor function intact in LE. Strength 5/5 LE bilaterally. Neuro: Distal pulses 2+. Sensation to light touch intact in LE. Vital signs in last 24 hours:    Labs:   Estimated body mass index is 39.55 kg/m as calculated from the following:   Height as of 12/04/18: 5\' 2"  (1.575 m).   Weight as of 12/04/18: 98.1 kg.   Imaging Review Plain radiographs demonstrate severe degenerative joint disease of the right knee(s). The overall alignment ismild varus. The bone quality appears to be adequate for age and reported activity level.  Assessment/Plan:  End stage arthritis, right knee   The patient history, physical examination, clinical judgment of the provider and imaging studies are consistent with end stage degenerative joint  disease of the right knee(s) and total knee arthroplasty is deemed medically necessary. The treatment options including medical management, injection therapy arthroscopy and arthroplasty were discussed at length. The risks and benefits of total knee arthroplasty were presented and reviewed. The risks due to aseptic loosening, infection, stiffness, patella tracking problems, thromboembolic complications and other imponderables were discussed. The patient acknowledged the explanation, agreed to proceed with the plan and consent was signed. Patient is being admitted for inpatient treatment for surgery, pain control, PT, OT, prophylactic antibiotics, VTE prophylaxis, progressive ambulation and ADL's and discharge planning. The patient is planning to be discharged home.  Therapy Plans: outpatient therapy at [facility] Disposition: Home with friend Planned DVT Prophylaxis: Xarelto 20 mg daily (history of a fib) DME needed: walker PCP: Dr. Adrian Prows Cardiologist: Dr. Bartholome Bill TXA: IV Allergies: aspirin - anaphylaxis, PCN - rash Anesthesia Concerns: none BMI: 40.1  Other: History of atrial fibrillation, on Xarelto. History of breast cancer, s/p lumpectomy in 2002. History of severe nose bleeds, concerned about COVID testing.   Patient's anticipated LOS is less than 2 midnights, meeting these requirements: - Lives within 1 hour of care - Has a competent adult at home to recover with post-op recover - NO history of  - Chronic pain requiring opiods  - Diabetes  - Coronary Artery Disease  - Heart failure  - Heart attack  - Stroke  - DVT/VTE  - Respiratory Failure/COPD  - Renal failure  - Anemia  - Advanced Liver disease  - Patient was instructed on what medications to stop prior to surgery. - Follow-up visit in 2 weeks with Dr. Wynelle Link - Begin physical therapy following surgery - Pre-operative lab work as pre-surgical testing - Prescriptions will be provided in  hospital at time of discharge  Griffith Citron, PA-C Orthopedic Surgery [facility] [place] [phone]

## 2018-12-10 ENCOUNTER — Encounter (HOSPITAL_COMMUNITY): Payer: Self-pay | Admitting: Emergency Medicine

## 2018-12-10 ENCOUNTER — Inpatient Hospital Stay (HOSPITAL_COMMUNITY): Payer: Medicare Other | Admitting: Emergency Medicine

## 2018-12-10 ENCOUNTER — Other Ambulatory Visit: Payer: Self-pay

## 2018-12-10 ENCOUNTER — Observation Stay (HOSPITAL_COMMUNITY)
Admission: RE | Admit: 2018-12-10 | Discharge: 2018-12-11 | Disposition: A | Discharge status: Home / Self Care | Payer: Medicare Other | Attending: Orthopedic Surgery | Admitting: Orthopedic Surgery

## 2018-12-10 ENCOUNTER — Encounter (HOSPITAL_COMMUNITY)
Admission: RE | Disposition: A | Discharge status: Home / Self Care | Payer: Self-pay | Source: Home / Self Care | Attending: Orthopedic Surgery

## 2018-12-10 DIAGNOSIS — E785 Hyperlipidemia, unspecified: Secondary | ICD-10-CM | POA: Diagnosis not present

## 2018-12-10 DIAGNOSIS — I4891 Unspecified atrial fibrillation: Secondary | ICD-10-CM | POA: Diagnosis not present

## 2018-12-10 DIAGNOSIS — M1711 Unilateral primary osteoarthritis, right knee: Secondary | ICD-10-CM | POA: Diagnosis not present

## 2018-12-10 DIAGNOSIS — I10 Essential (primary) hypertension: Secondary | ICD-10-CM | POA: Insufficient documentation

## 2018-12-10 DIAGNOSIS — Z923 Personal history of irradiation: Secondary | ICD-10-CM | POA: Insufficient documentation

## 2018-12-10 DIAGNOSIS — M179 Osteoarthritis of knee, unspecified: Secondary | ICD-10-CM

## 2018-12-10 DIAGNOSIS — M1710 Unilateral primary osteoarthritis, unspecified knee: Secondary | ICD-10-CM | POA: Diagnosis present

## 2018-12-10 DIAGNOSIS — M25761 Osteophyte, right knee: Secondary | ICD-10-CM | POA: Insufficient documentation

## 2018-12-10 DIAGNOSIS — M25561 Pain in right knee: Secondary | ICD-10-CM | POA: Diagnosis present

## 2018-12-10 DIAGNOSIS — Z87891 Personal history of nicotine dependence: Secondary | ICD-10-CM | POA: Insufficient documentation

## 2018-12-10 DIAGNOSIS — Z6839 Body mass index (BMI) 39.0-39.9, adult: Secondary | ICD-10-CM | POA: Insufficient documentation

## 2018-12-10 DIAGNOSIS — Z8542 Personal history of malignant neoplasm of other parts of uterus: Secondary | ICD-10-CM | POA: Diagnosis not present

## 2018-12-10 DIAGNOSIS — G4733 Obstructive sleep apnea (adult) (pediatric): Secondary | ICD-10-CM | POA: Insufficient documentation

## 2018-12-10 DIAGNOSIS — Z79899 Other long term (current) drug therapy: Secondary | ICD-10-CM | POA: Insufficient documentation

## 2018-12-10 DIAGNOSIS — Z853 Personal history of malignant neoplasm of breast: Secondary | ICD-10-CM | POA: Insufficient documentation

## 2018-12-10 DIAGNOSIS — E119 Type 2 diabetes mellitus without complications: Secondary | ICD-10-CM | POA: Insufficient documentation

## 2018-12-10 DIAGNOSIS — Z7901 Long term (current) use of anticoagulants: Secondary | ICD-10-CM | POA: Insufficient documentation

## 2018-12-10 HISTORY — PX: TOTAL KNEE ARTHROPLASTY: SHX125

## 2018-12-10 LAB — GLUCOSE, CAPILLARY
Glucose-Capillary: 102 mg/dL — ABNORMAL HIGH (ref 70–99)
Glucose-Capillary: 103 mg/dL — ABNORMAL HIGH (ref 70–99)

## 2018-12-10 LAB — PROTIME-INR
INR: 1.1 (ref 0.8–1.2)
Prothrombin Time: 14.1 seconds (ref 11.4–15.2)

## 2018-12-10 LAB — APTT: aPTT: 32 seconds (ref 24–36)

## 2018-12-10 LAB — TYPE AND SCREEN
ABO/RH(D): A POS
Antibody Screen: NEGATIVE

## 2018-12-10 SURGERY — ARTHROPLASTY, KNEE, TOTAL
Anesthesia: Spinal | Site: Knee | Laterality: Right

## 2018-12-10 MED ORDER — PHENYLEPHRINE HCL (PRESSORS) 10 MG/ML IV SOLN
INTRAVENOUS | Status: AC
  Filled 2018-12-10: qty 1

## 2018-12-10 MED ORDER — PHENOL 1.4 % MT LIQD: 1.0000 | OROMUCOSAL | Status: DC | PRN

## 2018-12-10 MED ORDER — ONDANSETRON HCL 4 MG/2ML IJ SOLN
INTRAMUSCULAR | Status: AC
  Filled 2018-12-10: qty 2

## 2018-12-10 MED ORDER — CHLORHEXIDINE GLUCONATE 4 % EX LIQD: 60.0000 mL | Freq: Once | CUTANEOUS | Status: DC

## 2018-12-10 MED ORDER — POVIDONE-IODINE 10 % EX SWAB
2.0000 "application " | Freq: Once | CUTANEOUS | Status: AC
  Administered 2018-12-10: 2 via TOPICAL

## 2018-12-10 MED ORDER — METOCLOPRAMIDE HCL 5 MG/ML IJ SOLN: 5.0000 mg | Freq: Three times a day (TID) | INTRAMUSCULAR | Status: DC | PRN

## 2018-12-10 MED ORDER — ACETAMINOPHEN 10 MG/ML IV SOLN
1000.0000 mg | Freq: Four times a day (QID) | INTRAVENOUS | Status: DC
  Administered 2018-12-10: 1000 mg via INTRAVENOUS
  Filled 2018-12-10: qty 100

## 2018-12-10 MED ORDER — GABAPENTIN 100 MG PO CAPS
100.0000 mg | ORAL_CAPSULE | Freq: Three times a day (TID) | ORAL | Status: DC
  Administered 2018-12-10 – 2018-12-11 (×3): 100 mg via ORAL
  Filled 2018-12-10 (×3): qty 1

## 2018-12-10 MED ORDER — METHOCARBAMOL 500 MG IVPB - SIMPLE MED
500.0000 mg | Freq: Four times a day (QID) | INTRAVENOUS | Status: DC | PRN
  Administered 2018-12-10: 13:00:00 500 mg via INTRAVENOUS
  Filled 2018-12-10: qty 50

## 2018-12-10 MED ORDER — PROPOFOL 10 MG/ML IV BOLUS
INTRAVENOUS | Status: DC | PRN
  Administered 2018-12-10: 20 mg via INTRAVENOUS

## 2018-12-10 MED ORDER — DEXAMETHASONE SODIUM PHOSPHATE 10 MG/ML IJ SOLN
INTRAMUSCULAR | Status: AC
  Filled 2018-12-10: qty 1

## 2018-12-10 MED ORDER — OXYBUTYNIN CHLORIDE ER 5 MG PO TB24
15.0000 mg | ORAL_TABLET | Freq: Every day | ORAL | Status: DC
  Administered 2018-12-11: 15 mg via ORAL
  Filled 2018-12-10: qty 3

## 2018-12-10 MED ORDER — DEXAMETHASONE SODIUM PHOSPHATE 4 MG/ML IJ SOLN
INTRAMUSCULAR | Status: DC | PRN
  Administered 2018-12-10: 8 mg via INTRAVENOUS
  Administered 2018-12-10: 8 mg via PERINEURAL

## 2018-12-10 MED ORDER — FENTANYL CITRATE (PF) 100 MCG/2ML IJ SOLN
INTRAMUSCULAR | Status: AC
  Filled 2018-12-10: qty 2

## 2018-12-10 MED ORDER — ATORVASTATIN CALCIUM 20 MG PO TABS
20.0000 mg | ORAL_TABLET | Freq: Every evening | ORAL | Status: DC
  Administered 2018-12-10: 18:00:00 20 mg via ORAL
  Filled 2018-12-10: qty 1

## 2018-12-10 MED ORDER — MENTHOL 3 MG MT LOZG: 1.0000 | LOZENGE | OROMUCOSAL | Status: DC | PRN

## 2018-12-10 MED ORDER — ACETAMINOPHEN 500 MG PO TABS
1000.0000 mg | ORAL_TABLET | Freq: Four times a day (QID) | ORAL | Status: DC
  Administered 2018-12-10 – 2018-12-11 (×3): 1000 mg via ORAL
  Filled 2018-12-10 (×3): qty 2

## 2018-12-10 MED ORDER — TRAMADOL HCL 50 MG PO TABS: 50.0000 mg | ORAL_TABLET | Freq: Four times a day (QID) | ORAL | Status: DC | PRN

## 2018-12-10 MED ORDER — FLEET ENEMA 7-19 GM/118ML RE ENEM: 1.0000 | ENEMA | Freq: Once | RECTAL | Status: DC | PRN

## 2018-12-10 MED ORDER — FENTANYL CITRATE (PF) 100 MCG/2ML IJ SOLN
INTRAMUSCULAR | Status: AC
  Administered 2018-12-10: 50 ug via INTRAVENOUS
  Filled 2018-12-10: qty 2

## 2018-12-10 MED ORDER — ONDANSETRON HCL 4 MG/2ML IJ SOLN: 4.0000 mg | Freq: Four times a day (QID) | INTRAMUSCULAR | Status: DC | PRN

## 2018-12-10 MED ORDER — ONDANSETRON HCL 4 MG/2ML IJ SOLN
INTRAMUSCULAR | Status: DC | PRN
  Administered 2018-12-10: 4 mg via INTRAVENOUS

## 2018-12-10 MED ORDER — MORPHINE SULFATE (PF) 2 MG/ML IV SOLN: 1.0000 mg | INTRAVENOUS | Status: DC | PRN

## 2018-12-10 MED ORDER — FENTANYL CITRATE (PF) 100 MCG/2ML IJ SOLN
50.0000 ug | INTRAMUSCULAR | Status: DC
  Administered 2018-12-10: 50 ug via INTRAVENOUS
  Filled 2018-12-10: qty 2

## 2018-12-10 MED ORDER — SODIUM CHLORIDE (PF) 0.9 % IJ SOLN
INTRAMUSCULAR | Status: AC
  Filled 2018-12-10: qty 10

## 2018-12-10 MED ORDER — BUPIVACAINE IN DEXTROSE 0.75-8.25 % IT SOLN
INTRATHECAL | Status: DC | PRN
  Administered 2018-12-10: 1.6 mL via INTRATHECAL

## 2018-12-10 MED ORDER — DOCUSATE SODIUM 100 MG PO CAPS
100.0000 mg | ORAL_CAPSULE | Freq: Two times a day (BID) | ORAL | Status: DC
  Administered 2018-12-10 – 2018-12-11 (×2): 100 mg via ORAL
  Filled 2018-12-10 (×2): qty 1

## 2018-12-10 MED ORDER — SODIUM CHLORIDE (PF) 0.9 % IJ SOLN
INTRAMUSCULAR | Status: AC
  Filled 2018-12-10: qty 50

## 2018-12-10 MED ORDER — FENTANYL CITRATE (PF) 100 MCG/2ML IJ SOLN
INTRAMUSCULAR | Status: DC | PRN
  Administered 2018-12-10: 50 ug via INTRAVENOUS

## 2018-12-10 MED ORDER — VANCOMYCIN HCL IN DEXTROSE 1-5 GM/200ML-% IV SOLN
1000.0000 mg | INTRAVENOUS | Status: AC
  Administered 2018-12-10: 1000 mg via INTRAVENOUS
  Filled 2018-12-10: qty 200

## 2018-12-10 MED ORDER — RIVAROXABAN 10 MG PO TABS
10.0000 mg | ORAL_TABLET | Freq: Every day | ORAL | Status: DC
  Administered 2018-12-11: 10 mg via ORAL
  Filled 2018-12-10: qty 1

## 2018-12-10 MED ORDER — PROPOFOL 500 MG/50ML IV EMUL
INTRAVENOUS | Status: DC | PRN
  Administered 2018-12-10: 50 ug/kg/min via INTRAVENOUS

## 2018-12-10 MED ORDER — HYDROCHLOROTHIAZIDE 25 MG PO TABS
25.0000 mg | ORAL_TABLET | Freq: Every day | ORAL | Status: DC
  Administered 2018-12-11: 25 mg via ORAL
  Filled 2018-12-10: qty 1

## 2018-12-10 MED ORDER — DEXAMETHASONE SODIUM PHOSPHATE 10 MG/ML IJ SOLN: 8.0000 mg | Freq: Once | INTRAMUSCULAR | Status: DC

## 2018-12-10 MED ORDER — VANCOMYCIN HCL IN DEXTROSE 1-5 GM/200ML-% IV SOLN
1000.0000 mg | Freq: Two times a day (BID) | INTRAVENOUS | Status: AC
  Administered 2018-12-10: 22:00:00 1000 mg via INTRAVENOUS
  Filled 2018-12-10: qty 200

## 2018-12-10 MED ORDER — OXYCODONE HCL 5 MG PO TABS
5.0000 mg | ORAL_TABLET | ORAL | Status: DC | PRN
  Administered 2018-12-10: 10 mg via ORAL
  Administered 2018-12-10 (×2): 5 mg via ORAL
  Administered 2018-12-11 (×2): 10 mg via ORAL
  Filled 2018-12-10 (×2): qty 2
  Filled 2018-12-10 (×2): qty 1
  Filled 2018-12-10: qty 2

## 2018-12-10 MED ORDER — METOCLOPRAMIDE HCL 5 MG PO TABS: 5.0000 mg | ORAL_TABLET | Freq: Three times a day (TID) | ORAL | Status: DC | PRN

## 2018-12-10 MED ORDER — POLYETHYLENE GLYCOL 3350 17 G PO PACK: 17.0000 g | PACK | Freq: Every day | ORAL | Status: DC | PRN

## 2018-12-10 MED ORDER — BUPIVACAINE LIPOSOME 1.3 % IJ SUSP
20.0000 mL | Freq: Once | INTRAMUSCULAR | Status: AC
  Administered 2018-12-10: 80 mL
  Filled 2018-12-10: qty 20

## 2018-12-10 MED ORDER — DIPHENHYDRAMINE HCL 12.5 MG/5ML PO ELIX: 12.5000 mg | ORAL_SOLUTION | ORAL | Status: DC | PRN

## 2018-12-10 MED ORDER — CLONIDINE HCL (ANALGESIA) 100 MCG/ML EP SOLN
EPIDURAL | Status: DC | PRN
  Administered 2018-12-10: 50 ug

## 2018-12-10 MED ORDER — DEXAMETHASONE SODIUM PHOSPHATE 10 MG/ML IJ SOLN
10.0000 mg | Freq: Once | INTRAMUSCULAR | Status: AC
  Administered 2018-12-11: 10 mg via INTRAVENOUS
  Filled 2018-12-10: qty 1

## 2018-12-10 MED ORDER — FENTANYL CITRATE (PF) 100 MCG/2ML IJ SOLN
25.0000 ug | INTRAMUSCULAR | Status: DC | PRN
  Administered 2018-12-10 (×3): 50 ug via INTRAVENOUS

## 2018-12-10 MED ORDER — SODIUM CHLORIDE (PF) 0.9 % IJ SOLN
INTRAMUSCULAR | Status: DC | PRN
  Administered 2018-12-10: 60 mL

## 2018-12-10 MED ORDER — TRANEXAMIC ACID-NACL 1000-0.7 MG/100ML-% IV SOLN
1000.0000 mg | INTRAVENOUS | Status: AC
  Administered 2018-12-10: 1000 mg via INTRAVENOUS
  Filled 2018-12-10: qty 100

## 2018-12-10 MED ORDER — ONDANSETRON HCL 4 MG PO TABS: 4.0000 mg | ORAL_TABLET | Freq: Four times a day (QID) | ORAL | Status: DC | PRN

## 2018-12-10 MED ORDER — METHOCARBAMOL 500 MG PO TABS: 500.0000 mg | ORAL_TABLET | Freq: Four times a day (QID) | ORAL | Status: DC | PRN

## 2018-12-10 MED ORDER — MIDAZOLAM HCL 2 MG/2ML IJ SOLN
1.0000 mg | INTRAMUSCULAR | Status: DC
  Filled 2018-12-10: qty 2

## 2018-12-10 MED ORDER — ROPIVACAINE HCL 7.5 MG/ML IJ SOLN
INTRAMUSCULAR | Status: DC | PRN
  Administered 2018-12-10: 20 mL via PERINEURAL

## 2018-12-10 MED ORDER — MEPERIDINE HCL 50 MG/ML IJ SOLN: 6.2500 mg | INTRAMUSCULAR | Status: DC | PRN

## 2018-12-10 MED ORDER — SODIUM CHLORIDE 0.9 % IV SOLN
INTRAVENOUS | Status: DC | PRN
  Administered 2018-12-10: 30 ug/min via INTRAVENOUS

## 2018-12-10 MED ORDER — PROPOFOL 10 MG/ML IV BOLUS
INTRAVENOUS | Status: AC
  Filled 2018-12-10: qty 60

## 2018-12-10 MED ORDER — MIDAZOLAM HCL 2 MG/2ML IJ SOLN
INTRAMUSCULAR | Status: AC
  Filled 2018-12-10: qty 2

## 2018-12-10 MED ORDER — SODIUM CHLORIDE 0.9 % IV SOLN
INTRAVENOUS | Status: DC
  Administered 2018-12-10: 15:00:00 via INTRAVENOUS

## 2018-12-10 MED ORDER — METOPROLOL SUCCINATE ER 50 MG PO TB24
50.0000 mg | ORAL_TABLET | Freq: Every day | ORAL | Status: DC
  Administered 2018-12-11: 50 mg via ORAL
  Filled 2018-12-10: qty 1

## 2018-12-10 MED ORDER — SODIUM CHLORIDE 0.9 % IV SOLN: INTRAVENOUS | Status: DC

## 2018-12-10 MED ORDER — METHOCARBAMOL 500 MG IVPB - SIMPLE MED
INTRAVENOUS | Status: AC
  Administered 2018-12-10: 13:00:00 500 mg via INTRAVENOUS
  Filled 2018-12-10: qty 50

## 2018-12-10 MED ORDER — LACTATED RINGERS IV SOLN
INTRAVENOUS | Status: DC
  Administered 2018-12-10 (×2): via INTRAVENOUS

## 2018-12-10 MED ORDER — BISACODYL 10 MG RE SUPP: 10.0000 mg | Freq: Every day | RECTAL | Status: DC | PRN

## 2018-12-10 MED ORDER — ONDANSETRON HCL 4 MG/2ML IJ SOLN: 4.0000 mg | Freq: Once | INTRAMUSCULAR | Status: DC | PRN

## 2018-12-10 SURGICAL SUPPLY — 58 items
BAG SPEC THK2 15X12 ZIP CLS (MISCELLANEOUS)
BAG ZIPLOCK 12X15 (MISCELLANEOUS) ×1 IMPLANT
BLADE SAG 18X100X1.27 (BLADE) ×3 IMPLANT
BLADE SAW SGTL 11.0X1.19X90.0M (BLADE) ×3 IMPLANT
BLADE SURG SZ10 CARB STEEL (BLADE) ×6 IMPLANT
BNDG ELASTIC 6X5.8 VLCR STR LF (GAUZE/BANDAGES/DRESSINGS) ×3 IMPLANT
BOWL SMART MIX CTS (DISPOSABLE) ×3 IMPLANT
CEMENT HV SMART SET (Cement) ×6 IMPLANT
CEMENT TIBIA MBT (Knees) IMPLANT
CLOSURE WOUND 1/2 X4 (GAUZE/BANDAGES/DRESSINGS) ×2
COVER SURGICAL LIGHT HANDLE (MISCELLANEOUS) ×3 IMPLANT
COVER WAND RF STERILE (DRAPES) IMPLANT
CUFF TOURN SGL QUICK 34 (TOURNIQUET CUFF) ×3
CUFF TRNQT CYL 34X4.125X (TOURNIQUET CUFF) ×1 IMPLANT
DECANTER SPIKE VIAL GLASS SM (MISCELLANEOUS) ×3 IMPLANT
DRAPE U-SHAPE 47X51 STRL (DRAPES) ×3 IMPLANT
DRSG ADAPTIC 3X8 NADH LF (GAUZE/BANDAGES/DRESSINGS) ×3 IMPLANT
DRSG PAD ABDOMINAL 8X10 ST (GAUZE/BANDAGES/DRESSINGS) ×3 IMPLANT
DURAPREP 26ML APPLICATOR (WOUND CARE) ×3 IMPLANT
ELECT REM PT RETURN 15FT ADLT (MISCELLANEOUS) ×3 IMPLANT
EVACUATOR 1/8 PVC DRAIN (DRAIN) ×3 IMPLANT
FEMUR SIGMA PS SZ 3.0 R (Femur) ×2 IMPLANT
GAUZE SPONGE 4X4 12PLY STRL (GAUZE/BANDAGES/DRESSINGS) ×3 IMPLANT
GLOVE BIO SURGEON STRL SZ7 (GLOVE) ×3 IMPLANT
GLOVE BIO SURGEON STRL SZ8 (GLOVE) ×13 IMPLANT
GLOVE BIOGEL PI IND STRL 6.5 (GLOVE) ×1 IMPLANT
GLOVE BIOGEL PI IND STRL 7.0 (GLOVE) ×1 IMPLANT
GLOVE BIOGEL PI IND STRL 8 (GLOVE) ×1 IMPLANT
GLOVE BIOGEL PI INDICATOR 6.5 (GLOVE) ×2
GLOVE BIOGEL PI INDICATOR 7.0 (GLOVE) ×2
GLOVE BIOGEL PI INDICATOR 8 (GLOVE) ×2
GLOVE SURG SS PI 6.5 STRL IVOR (GLOVE) ×3 IMPLANT
GOWN STRL REUS W/TWL LRG LVL3 (GOWN DISPOSABLE) ×11 IMPLANT
HANDPIECE INTERPULSE COAX TIP (DISPOSABLE) ×3
HOLDER FOLEY CATH W/STRAP (MISCELLANEOUS) ×2 IMPLANT
IMMOBILIZER KNEE 20 (SOFTGOODS) ×3
IMMOBILIZER KNEE 20 THIGH 36 (SOFTGOODS) ×1 IMPLANT
INSERT TIBIAL PFC SIG SZ3 10MM (Knees) ×2 IMPLANT
KIT TURNOVER KIT A (KITS) IMPLANT
MANIFOLD NEPTUNE II (INSTRUMENTS) ×3 IMPLANT
NS IRRIG 1000ML POUR BTL (IV SOLUTION) ×3 IMPLANT
PACK TOTAL KNEE CUSTOM (KITS) ×3 IMPLANT
PADDING CAST COTTON 6X4 STRL (CAST SUPPLIES) ×9 IMPLANT
PATELLA DOME PFC 35MM (Knees) ×2 IMPLANT
PIN STEINMAN FIXATION KNEE (PIN) ×2 IMPLANT
PROTECTOR NERVE ULNAR (MISCELLANEOUS) ×3 IMPLANT
SET HNDPC FAN SPRY TIP SCT (DISPOSABLE) ×1 IMPLANT
STRIP CLOSURE SKIN 1/2X4 (GAUZE/BANDAGES/DRESSINGS) ×4 IMPLANT
SUT MNCRL AB 4-0 PS2 18 (SUTURE) ×3 IMPLANT
SUT STRATAFIX 0 PDS 27 VIOLET (SUTURE) ×3
SUT VIC AB 2-0 CT1 27 (SUTURE) ×9
SUT VIC AB 2-0 CT1 TAPERPNT 27 (SUTURE) ×3 IMPLANT
SUTURE STRATFX 0 PDS 27 VIOLET (SUTURE) ×1 IMPLANT
TIBIA MBT CEMENT (Knees) ×3 IMPLANT
TRAY FOLEY MTR SLVR 16FR STAT (SET/KITS/TRAYS/PACK) ×3 IMPLANT
WATER STERILE IRR 1000ML POUR (IV SOLUTION) ×6 IMPLANT
WRAP KNEE MAXI GEL POST OP (GAUZE/BANDAGES/DRESSINGS) ×3 IMPLANT
YANKAUER SUCT BULB TIP 10FT TU (MISCELLANEOUS) ×3 IMPLANT

## 2018-12-10 NOTE — Progress Notes (Signed)
Assisted Dr. Germeroth with right, ultrasound guided, adductor canal block. Side rails up, monitors on throughout procedure. See vital signs in flow sheet. Tolerated Procedure well. 

## 2018-12-10 NOTE — Anesthesia Procedure Notes (Signed)
Spinal  Patient location during procedure: OR End time: 12/10/2018 11:14 AM Staffing Resident/CRNA: Lissa Morales, CRNA Performed: resident/CRNA  Preanesthetic Checklist Completed: patient identified, site marked, surgical consent, pre-op evaluation, timeout performed, IV checked, risks and benefits discussed and monitors and equipment checked Spinal Block Patient position: sitting Prep: DuraPrep Patient monitoring: heart rate, continuous pulse ox and blood pressure Approach: midline Location: L4-5 Injection technique: single-shot Needle Needle type: Pencan  Needle gauge: 24 G Needle length: 9 cm Additional Notes Expiration date of kit checked and confirmed. Patient tolerated procedure well, without complications.

## 2018-12-10 NOTE — Anesthesia Procedure Notes (Signed)
Procedure Name: MAC Date/Time: 12/10/2018 11:06 AM Performed by: Lissa Morales, CRNA Pre-anesthesia Checklist: Patient identified, Emergency Drugs available, Suction available, Patient being monitored and Timeout performed Patient Re-evaluated:Patient Re-evaluated prior to induction Oxygen Delivery Method: Simple face mask Placement Confirmation: positive ETCO2

## 2018-12-10 NOTE — Plan of Care (Signed)
Plan of care 

## 2018-12-10 NOTE — Discharge Instructions (Addendum)
° °Dr. Frank Aluisio °Total Joint Specialist °[facility] °[address] °[place], [place] [address] °[phone] ° °TOTAL KNEE REPLACEMENT POSTOPERATIVE DIRECTIONS ° °Knee Rehabilitation, Guidelines Following Surgery  °Results after knee surgery are often greatly improved when you follow the exercise, range of motion and muscle strengthening exercises prescribed by your doctor. Safety measures are also important to protect the knee from further injury. Any time any of these exercises cause you to have increased pain or swelling in your knee joint, decrease the amount until you are comfortable again and slowly increase them. If you have problems or questions, call your caregiver or physical therapist for advice.  ° °HOME CARE INSTRUCTIONS  °Remove items at home which could result in a fall. This includes throw rugs or furniture in walking pathways.  °· ICE to the affected knee every three hours for 30 minutes at a time and then as needed for pain and swelling.  Continue to use ice on the knee for pain and swelling from surgery. You may notice swelling that will progress down to the foot and ankle.  This is normal after surgery.  Elevate the leg when you are not up walking on it.   °· Continue to use the breathing machine which will help keep your temperature down.  It is common for your temperature to cycle up and down following surgery, especially at night when you are not up moving around and exerting yourself.  The breathing machine keeps your lungs expanded and your temperature down. °· Do not place pillow under knee, focus on keeping the knee straight while resting ° °DIET °You may resume your previous home diet once your are discharged from the hospital. ° °DRESSING / WOUND CARE / SHOWERING °You may shower 3 days after surgery, but keep the wounds dry during showering.  You may use an occlusive plastic wrap (Press'n Seal for example), NO SOAKING/SUBMERGING IN THE BATHTUB.  If the bandage gets  wet, change with a clean dry gauze.  If the incision gets wet, pat the wound dry with a clean towel. °You may start showering once you are discharged home but do not submerge the incision under water. Just pat the incision dry and apply a dry gauze dressing on daily. °Change the surgical dressing daily and reapply a dry dressing each time. ° °ACTIVITY °Walk with your walker as instructed. °Use walker as long as suggested by your caregivers. °Avoid periods of inactivity such as sitting longer than an hour when not asleep. This helps prevent blood clots.  °You may resume a sexual relationship in one month or when given the OK by your doctor.  °You may return to work once you are cleared by your doctor.  °Do not drive a car for 6 weeks or until released by you surgeon.  °Do not drive while taking narcotics. ° °WEIGHT BEARING °Weight bearing as tolerated with assist device (walker, cane, etc) as directed, use it as long as suggested by your surgeon or therapist, typically at least 4-6 weeks. ° °POSTOPERATIVE CONSTIPATION PROTOCOL °Constipation - defined medically as fewer than three stools per week and severe constipation as less than one stool per week. ° °One of the most common issues patients have following surgery is constipation.  Even if you have a regular bowel pattern at home, your normal regimen is likely to be disrupted due to multiple reasons following surgery.  Combination of anesthesia, postoperative narcotics, change in appetite and fluid intake all can affect your bowels.    In order to avoid complications following surgery, here are some recommendations in order to help you during your recovery period.  Colace (docusate) - Pick up an over-the-counter form of Colace or another stool softener and take twice a day as long as you are requiring postoperative pain medications.  Take with a full glass of water daily.  If you experience loose stools or diarrhea, hold the colace until you stool forms back up.  If  your symptoms do not get better within 1 week or if they get worse, check with your doctor.  Dulcolax (bisacodyl) - Pick up over-the-counter and take as directed by the product packaging as needed to assist with the movement of your bowels.  Take with a full glass of water.  Use this product as needed if not relieved by Colace only.   MiraLax (polyethylene glycol) - Pick up over-the-counter to have on hand.  MiraLax is a solution that will increase the amount of water in your bowels to assist with bowel movements.  Take as directed and can mix with a glass of water, juice, soda, coffee, or tea.  Take if you go more than two days without a movement. Do not use MiraLax more than once per day. Call your doctor if you are still constipated or irregular after using this medication for 7 days in a row.  If you continue to have problems with postoperative constipation, please contact the office for further assistance and recommendations.  If you experience "the worst abdominal pain ever" or develop nausea or vomiting, please contact the office immediatly for further recommendations for treatment.  ITCHING  If you experience itching with your medications, try taking only a single pain pill, or even half a pain pill at a time.  You can also use Benadryl over the counter for itching or also to help with sleep.   TED HOSE STOCKINGS Wear the elastic stockings on both legs for three weeks following surgery during the day but you may remove then at night for sleeping.  MEDICATIONS See your medication summary on the After Visit Summary that the nursing staff will review with you prior to discharge.  You may have some home medications which will be placed on hold until you complete the course of blood thinner medication.  It is important for you to complete the blood thinner medication as prescribed by your surgeon.  Continue your approved medications as instructed at time of discharge.  Gabapentin 100 mg  Protocol Take a 100 mg capsule three times a day for two weeks, Then a 100 mg capsule twice a day for two weeks, Then a 100 mg capsule once a day for two weeks, then discontinue the Gabapentin.  PRECAUTIONS If you experience chest pain or shortness of breath - call 911 immediately for transfer to the hospital emergency department.  If you develop a fever greater that 101 F, purulent drainage from wound, increased redness or drainage from wound, foul odor from the wound/dressing, or calf pain - CONTACT YOUR SURGEON.                                                   FOLLOW-UP APPOINTMENTS Make sure you keep all of your appointments after your operation with your surgeon and caregivers. You should call the office at the above phone number and make an appointment for  approximately two weeks after the date of your surgery or on the date instructed by your surgeon outlined in the "After Visit Summary". ° ° °RANGE OF MOTION AND STRENGTHENING EXERCISES  °Rehabilitation of the knee is important following a knee injury or an operation. After just a few days of immobilization, the muscles of the thigh which control the knee become weakened and shrink (atrophy). Knee exercises are designed to build up the tone and strength of the thigh muscles and to improve knee motion. Often times heat used for twenty to thirty minutes before working out will loosen up your tissues and help with improving the range of motion but do not use heat for the first two weeks following surgery. These exercises can be done on a training (exercise) mat, on the floor, on a table or on a bed. Use what ever works the best and is most comfortable for you Knee exercises include:  °Leg Lifts - While your knee is still immobilized in a splint or cast, you can do straight leg raises. Lift the leg to 60 degrees, hold for 3 sec, and slowly lower the leg. Repeat 10-20 times 2-3 times daily. Perform this exercise against resistance later as your knee  gets better.  °Quad and Hamstring Sets - Tighten up the muscle on the front of the thigh (Quad) and hold for 5-10 sec. Repeat this 10-20 times hourly. Hamstring sets are done by pushing the foot backward against an object and holding for 5-10 sec. Repeat as with quad sets.  °· Leg Slides: Lying on your back, slowly slide your foot toward your buttocks, bending your knee up off the floor (only go as far as is comfortable). Then slowly slide your foot back down until your leg is flat on the floor again. °· Angel Wings: Lying on your back spread your legs to the side as far apart as you can without causing discomfort.  °A rehabilitation program following serious knee injuries can speed recovery and prevent re-injury in the future due to weakened muscles. Contact your doctor or a physical therapist for more information on knee rehabilitation.  ° °IF YOU ARE TRANSFERRED TO A SKILLED REHAB FACILITY °If the patient is transferred to a skilled rehab facility following release from the hospital, a list of the current medications will be sent to the facility for the patient to continue.  When discharged from the skilled rehab facility, please have the facility set up the patient's Home Health Physical Therapy prior to being released. Also, the skilled facility will be responsible for providing the patient with their medications at time of release from the facility to include their pain medication, the muscle relaxants, and their blood thinner medication. If the patient is still at the rehab facility at time of the two week follow up appointment, the skilled rehab facility will also need to assist the patient in arranging follow up appointment in our office and any transportation needs. ° °MAKE SURE YOU:  °Understand these instructions.  °Get help right away if you are not doing well or get worse.  ° ° °Pick up stool softner and laxative for home use following surgery while on pain medications. °Do not submerge incision under  water. °Please use good hand washing techniques while changing dressing each day. °May shower starting three days after surgery. °Please use a clean towel to pat the incision dry following showers. °Continue to use ice for pain and swelling after surgery. °Do not use any lotions or creams on the   incision until instructed by your surgeon.  Information on my medicine - XARELTO (Rivaroxaban)  This medication education was reviewed with me or my healthcare representative as part of my discharge preparation.  The pharmacist that spoke with me during my hospital stay was:    Why was Xarelto prescribed for you? Xarelto was prescribed for you to reduce the risk of blood clots forming after orthopedic surgery. The medical term for these abnormal blood clots is venous thromboembolism (VTE).  What do you need to know about xarelto ? Take your Xarelto ONCE DAILY at the same time every day. You may take it either with or without food.  If you have difficulty swallowing the tablet whole, you may crush it and mix in applesauce just prior to taking your dose.  Take Xarelto exactly as prescribed by your doctor and DO NOT stop taking Xarelto without talking to the doctor who prescribed the medication.  Stopping without other VTE prevention medication to take the place of Xarelto may increase your risk of developing a clot.  After discharge, you should have regular check-up appointments with your healthcare provider that is prescribing your Xarelto.    What do you do if you miss a dose? If you miss a dose, take it as soon as you remember on the same day then continue your regularly scheduled once daily regimen the next day. Do not take two doses of Xarelto on the same day.   Important Safety Information A possible side effect of Xarelto is bleeding. You should call your healthcare provider right away if you experience any of the following: ? Bleeding from an injury or your nose that does not  stop. ? Unusual colored urine (red or dark brown) or unusual colored stools (red or black). ? Unusual bruising for unknown reasons. ? A serious fall or if you hit your head (even if there is no bleeding).  Some medicines may interact with Xarelto and might increase your risk of bleeding while on Xarelto. To help avoid this, consult your healthcare provider or pharmacist prior to using any new prescription or non-prescription medications, including herbals, vitamins, non-steroidal anti-inflammatory drugs (NSAIDs) and supplements.  This website has more information on Xarelto: [email or web address]

## 2018-12-10 NOTE — Evaluation (Signed)
Physical Therapy Evaluation Patient Details Name: Brooke Krueger MRN: [id number] DOB: 1940-11-16 Today's Date: 12/10/2018   History of Present Illness  s/p R TKA  Clinical Impression  Pt is s/p TKA resulting in the deficits listed below (see PT Problem List).  amb 50' with RW and min assist. Anticipate steady progress   Pt will benefit from skilled PT to increase their independence and safety with mobility to allow discharge to the venue listed below.      Follow Up Recommendations Follow surgeon's recommendation for DC plan and follow-up therapies    Equipment Recommendations  Rolling walker with 5" wheels;3in1 (PT)    Recommendations for Other Services       Precautions / Restrictions Precautions Precautions: Knee;Fall Required Braces or Orthoses: Knee Immobilizer - Right Knee Immobilizer - Right: Discontinue once straight leg raise with < 10 degree lag Restrictions Weight Bearing Restrictions: No Other Position/Activity Restrictions: WBAT      Mobility  Bed Mobility Overal bed mobility: Needs Assistance Bed Mobility: Supine to Sit     Supine to sit: Min guard     General bed mobility comments: for safety  Transfers Overall transfer level: Needs assistance Equipment used: Rolling walker (2 wheeled) Transfers: Sit to/from Stand Sit to Stand: Min assist         General transfer comment: cues for hand placement and RLE position  Ambulation/Gait Ambulation/Gait assistance: Min guard;Min assist Gait Distance (Feet): 50 Feet Assistive device: Rolling walker (2 wheeled) Gait Pattern/deviations: Step-to pattern;Decreased stance time - right;Decreased weight shift to right     General Gait Details: cues for sequence and RW safety  Stairs            Wheelchair Mobility    Modified Rankin (Stroke Patients Only)       Balance                                             Pertinent Vitals/Pain Pain Assessment: 0-10 Pain  Score: 8  Pain Location: right hand Pain Descriptors / Indicators: Aching;Grimacing Pain Intervention(s): Limited activity within patient's tolerance;Monitored during session;Premedicated before session;Repositioned    Home Living Family/patient expects to be discharged to:: Private residence Living Arrangements: Alone Available Help at Discharge: Family Type of Home: House Home Access: Stairs to enter Entrance Stairs-Rails: Psychiatric nurse of Steps: 4 with  2 rails can't reach both or 2 one rail Home Layout: Multi-level Home Equipment: None      Prior Function Level of Independence: Independent               Hand Dominance        Extremity/Trunk Assessment   Upper Extremity Assessment Upper Extremity Assessment: Overall WFL for tasks assessed    Lower Extremity Assessment Lower Extremity Assessment: RLE deficits/detail RLE Deficits / Details: ankle WFL, knee and hip grossly 2+/5; knee flexion 5 to 60 degrees       Communication   Communication: No difficulties  Cognition Arousal/Alertness: Awake/alert Behavior During Therapy: WFL for tasks assessed/performed Overall Cognitive Status: Within Functional Limits for tasks assessed                                        General Comments      Exercises Total Joint Exercises Ankle Circles/Pumps: AROM;Both;10  reps Quad Sets: AROM;Both;10 reps   Assessment/Plan    PT Assessment Patient needs continued PT services  PT Problem List Decreased strength;Decreased range of motion;Decreased activity tolerance;Decreased balance;Decreased mobility;Pain;Decreased knowledge of use of DME       PT Treatment Interventions DME instruction;Gait training;Functional mobility training;Therapeutic activities;Therapeutic exercise;Patient/family education;Stair training    PT Goals (Current goals can be found in the Care Plan section)  Acute Rehab PT Goals PT Goal Formulation: With  patient Time For Goal Achievement: 12/17/18 Potential to Achieve Goals: Good    Frequency 7X/week   Barriers to discharge        Co-evaluation               AM-PAC PT "6 Clicks" Mobility  Outcome Measure Help needed turning from your back to your side while in a flat bed without using bedrails?: A Little Help needed moving from lying on your back to sitting on the side of a flat bed without using bedrails?: A Little Help needed moving to and from a bed to a chair (including a wheelchair)?: A Little Help needed standing up from a chair using your arms (e.g., wheelchair or bedside chair)?: A Little Help needed to walk in hospital room?: A Little Help needed climbing 3-5 steps with a railing? : A Lot 6 Click Score: 17    End of Session Equipment Utilized During Treatment: Gait belt Activity Tolerance: Patient tolerated treatment well Patient left: in chair;with call bell/phone within reach;with chair alarm set   PT Visit Diagnosis: Difficulty in walking, not elsewhere classified (R26.2)    Time: [id number] PT Time Calculation (min) (ACUTE ONLY): 23 min   Charges:   PT Evaluation $PT Eval Low Complexity: 1 Low PT Treatments $Gait Training: 8-22 mins        Kenyon Ana, PT  Pager: [phone] Acute Rehab Dept [facility]): [phone]   12/10/2018   [facility] 12/10/2018, 7:40 PM

## 2018-12-10 NOTE — Op Note (Signed)
OPERATIVE REPORT-TOTAL KNEE ARTHROPLASTY   Pre-operative diagnosis- Osteoarthritis  Right knee(s)  Post-operative diagnosis- Osteoarthritis Right knee(s)  Procedure-  Right  Total Knee Arthroplasty  Surgeon- Dione Plover. Zvi Duplantis, MD  Assistant- Ardeen Jourdain, PA-C   Anesthesia-  Adductor canal block and Spinal  EBL- 25 ml   Drains Hemovac  Tourniquet time- 33 minutes @ [id number] mmHg  Complications- None  Condition-PACU - hemodynamically stable.   Brief Clinical Note  Brooke Krueger is a 78 y.o. year old female with end stage OA of her right knee with progressively worsening pain and dysfunction. She has constant pain, with activity and at rest and significant functional deficits with difficulties even with ADLs. She has had extensive non-op management including analgesics, injections of cortisone and viscosupplements, and home exercise program, but remains in significant pain with significant dysfunction.Radiographs show bone on bone arthritis medial and patellofemoral. She presents now for right Total Knee Arthroplasty.    Procedure in detail---   The patient is brought into the operating room and positioned supine on the operating table. After successful administration of  Adductor canal block and Spinal,   a tourniquet is placed high on the  Right thigh(s) and the lower extremity is prepped and draped in the usual sterile fashion. Time out is performed by the operating team and then the  Right lower extremity is wrapped in Esmarch, knee flexed and the tourniquet inflated to 300 mmHg.       A midline incision is made with a ten blade through the subcutaneous tissue to the level of the extensor mechanism. A fresh blade is used to make a medial parapatellar arthrotomy. Soft tissue over the proximal medial tibia is subperiosteally elevated to the joint line with a knife and into the semimembranosus bursa with a Cobb elevator. Soft tissue over the proximal lateral tibia is elevated with  attention being paid to avoiding the patellar tendon on the tibial tubercle. The patella is everted, knee flexed 90 degrees and the ACL and PCL are removed. Findings are bone on bone medial and patellofemoral with large global osteophytes.        The drill is used to create a starting hole in the distal femur and the canal is thoroughly irrigated with sterile saline to remove the fatty contents. The 5 degree Right  valgus alignment guide is placed into the femoral canal and the distal femoral cutting block is pinned to remove 10 mm off the distal femur. Resection is made with an oscillating saw.      The tibia is subluxed forward and the menisci are removed. The extramedullary alignment guide is placed referencing proximally at the medial aspect of the tibial tubercle and distally along the second metatarsal axis and tibial crest. The block is pinned to remove 46mm off the more deficient medial  side. Resection is made with an oscillating saw. Size 3is the most appropriate size for the tibia and the proximal tibia is prepared with the modular drill and keel punch for that size.      The femoral sizing guide is placed and size 3 is most appropriate. Rotation is marked off the epicondylar axis and confirmed by creating a rectangular flexion gap at 90 degrees. The size 3 cutting block is pinned in this rotation and the anterior, posterior and chamfer cuts are made with the oscillating saw. The intercondylar block is then placed and that cut is made.      Trial size 3 tibial component, trial size 3 posterior stabilized  femur and a 10  mm posterior stabilized rotating platform insert trial is placed. Full extension is achieved with excellent varus/valgus and anterior/posterior balance throughout full range of motion. The patella is everted and thickness measured to be 22  mm. Free hand resection is taken to 12 mm, a 35 template is placed, lug holes are drilled, trial patella is placed, and it tracks normally.  Osteophytes are removed off the posterior femur with the trial in place. All trials are removed and the cut bone surfaces prepared with pulsatile lavage. Cement is mixed and once ready for implantation, the size 3 tibial implant, size  3 posterior stabilized femoral component, and the size 35 patella are cemented in place and the patella is held with the clamp. The trial insert is placed and the knee held in full extension. The Exparel (20 ml mixed with 60 ml saline) is injected into the extensor mechanism, posterior capsule, medial and lateral gutters and subcutaneous tissues.  All extruded cement is removed and once the cement is hard the permanent 10 mm posterior stabilized rotating platform insert is placed into the tibial tray.      The wound is copiously irrigated with saline solution and the extensor mechanism closed over a hemovac drain with #1 V-loc suture. The tourniquet is released for a total tourniquet time of 34  minutes. Flexion against gravity is 140 degrees and the patella tracks normally. Subcutaneous tissue is closed with 2.0 vicryl and subcuticular with running 4.0 Monocryl. The incision is cleaned and dried and steri-strips and a bulky sterile dressing are applied. The limb is placed into a knee immobilizer and the patient is awakened and transported to recovery in stable condition.      Please note that a surgical assistant was a medical necessity for this procedure in order to perform it in a safe and expeditious manner. Surgical assistant was necessary to retract the ligaments and vital neurovascular structures to prevent injury to them and also necessary for proper positioning of the limb to allow for anatomic placement of the prosthesis.   Dione Plover Saraann Enneking, MD    12/10/2018, 12:12 PM

## 2018-12-10 NOTE — Interval H&P Note (Signed)
History and Physical Interval Note:  12/10/2018 8:33 AM  Brooke Krueger  has presented today for surgery, with the diagnosis of right knee osteoarthritis.  The various methods of treatment have been discussed with the patient and family. After consideration of risks, benefits and other options for treatment, the patient has consented to  Procedure(s) with comments: TOTAL KNEE ARTHROPLASTY (Right) - 2min as a surgical intervention.  The patient's history has been reviewed, patient examined, no change in status, stable for surgery.  I have reviewed the patient's chart and labs.  Questions were answered to the patient's satisfaction.     Pilar Plate Adaria Hole

## 2018-12-10 NOTE — Anesthesia Postprocedure Evaluation (Signed)
Anesthesia Post Note  Patient: Brooke Krueger  Procedure(s) Performed: TOTAL KNEE ARTHROPLASTY (Right Knee)     Patient location during evaluation: PACU Anesthesia Type: Spinal Level of consciousness: oriented and awake and alert Pain management: pain level controlled Vital Signs Assessment: post-procedure vital signs reviewed and stable Respiratory status: spontaneous breathing, respiratory function stable and patient connected to nasal cannula oxygen Cardiovascular status: blood pressure returned to baseline and stable Postop Assessment: no headache, no backache and no apparent nausea or vomiting Anesthetic complications: no    Last Vitals:  Vitals:   12/10/18 1330 12/10/18 1355  BP: 119/75 96/85  Pulse: 70 74  Resp: 19 16  Temp: 36.4 C 36.4 C  SpO2: 100% 100%    Last Pain:  Vitals:   12/10/18 1355  TempSrc: Oral  PainSc:                  Nolon Nations

## 2018-12-10 NOTE — Anesthesia Procedure Notes (Signed)
Anesthesia Regional Block: Adductor canal block   Pre-Anesthetic Checklist: ,, timeout performed, Correct Patient, Correct Site, Correct Laterality, Correct Procedure, Correct Position, site marked, Risks and benefits discussed,  Surgical consent,  Pre-op evaluation,  At surgeon's request and post-op pain management  Laterality: Right  Prep: chloraprep       Needles:  Injection technique: Single-shot  Needle Type: Stimiplex     Needle Length: 9cm  Needle Gauge: 21     Additional Needles:   Procedures:,,,, ultrasound used (permanent image in chart),,,,  Narrative:  Start time: 12/10/2018 10:38 AM End time: 12/10/2018 10:43 AM Injection made incrementally with aspirations every 5 mL.  Performed by: Personally  Anesthesiologist: Nolon Nations, MD  Additional Notes: BP cuff, EKG monitors applied. Sedation begun. Artery and nerve location verified with U/S and anesthetic injected incrementally, slowly, and after negative aspirations under direct u/s guidance. Good fascial /perineural spread. Tolerated well.

## 2018-12-10 NOTE — Transfer of Care (Signed)
Immediate Anesthesia Transfer of Care Note  Patient: Brooke Krueger  Procedure(s) Performed: TOTAL KNEE ARTHROPLASTY (Right Knee)  Patient Location: PACU  Anesthesia Type:Spinal  Level of Consciousness: awake, alert , oriented and patient cooperative  Airway & Oxygen Therapy: Patient Spontanous Breathing and Patient connected to face mask oxygen  Post-op Assessment: Report given to RN and Post -op Vital signs reviewed and stable  Post vital signs: stable  Last Vitals:  Vitals Value Taken Time  BP 126/88 12/10/18 1230  Temp    Pulse 66 12/10/18 1233  Resp 24 12/10/18 1233  SpO2 100 % 12/10/18 1233  Vitals shown include unvalidated device data.  Last Pain:  Vitals:   12/10/18 1046  TempSrc:   PainSc: 0-No pain      Patients Stated Pain Goal: 4 ([id number])  Complications: No apparent anesthesia complications

## 2018-12-11 ENCOUNTER — Encounter (HOSPITAL_COMMUNITY): Payer: Self-pay | Admitting: Orthopedic Surgery

## 2018-12-11 DIAGNOSIS — M1711 Unilateral primary osteoarthritis, right knee: Secondary | ICD-10-CM | POA: Diagnosis not present

## 2018-12-11 LAB — CBC
HCT: 36.5 % (ref 36.0–46.0)
Hemoglobin: 11.5 g/dL — ABNORMAL LOW (ref 12.0–15.0)
MCH: 28 pg (ref 26.0–34.0)
MCHC: 31.5 g/dL (ref 30.0–36.0)
MCV: 88.8 fL (ref 80.0–100.0)
Platelets: 168 10*3/uL (ref 150–400)
RBC: 4.11 MIL/uL (ref 3.87–5.11)
RDW: 14.8 % (ref 11.5–15.5)
WBC: 12.3 10*3/uL — ABNORMAL HIGH (ref 4.0–10.5)
nRBC: 0 % (ref 0.0–0.2)

## 2018-12-11 LAB — BASIC METABOLIC PANEL
Anion gap: 9 (ref 5–15)
BUN: 14 mg/dL (ref 8–23)
CO2: 25 mmol/L (ref 22–32)
Calcium: 9 mg/dL (ref 8.9–10.3)
Chloride: 104 mmol/L (ref 98–111)
Creatinine, Ser: 0.54 mg/dL (ref 0.44–1.00)
GFR calc Af Amer: >60 mL/min (ref >60)
GFR calc non Af Amer: >60 mL/min (ref >60)
Glucose, Bld: 128 mg/dL — ABNORMAL HIGH (ref 70–99)
Potassium: 4.5 mmol/L (ref 3.5–5.1)
Sodium: 138 mmol/L (ref 135–145)

## 2018-12-11 MED ORDER — OXYCODONE HCL 5 MG PO TABS: 5.0000 mg | ORAL_TABLET | Freq: Four times a day (QID) | ORAL | 0 refills | Status: DC | PRN

## 2018-12-11 MED ORDER — METHOCARBAMOL 500 MG PO TABS: 500.0000 mg | ORAL_TABLET | Freq: Four times a day (QID) | ORAL | 0 refills | Status: DC | PRN

## 2018-12-11 MED ORDER — TRAMADOL HCL 50 MG PO TABS: 50.0000 mg | ORAL_TABLET | Freq: Four times a day (QID) | ORAL | 0 refills | Status: DC | PRN

## 2018-12-11 MED ORDER — GABAPENTIN 100 MG PO CAPS: 100.0000 mg | ORAL_CAPSULE | Freq: Three times a day (TID) | ORAL | 0 refills | Status: DC

## 2018-12-11 NOTE — Progress Notes (Signed)
Subjective: 1 Day Post-Op Procedure(s) (LRB): TOTAL KNEE ARTHROPLASTY (Right) Patient reports pain as mild.   Patient seen in rounds by Dr. Wynelle Link. Patient is well, and has had no acute complaints or problems other than discomfort in the right knee. No acute events overnight. Ambulated 50 feet with PT. Foley catheter removed, positive flatus. Denies CP, SHOB.  We will continue therapy today.   Objective: Vital signs in last 24 hours: Temp:  [97.6 F (36.4 C)-98.3 F (36.8 C)] 97.6 F (36.4 C) (10/27 0440) Pulse Rate:  [70-140] 80 (10/27 0440) Resp:  [8-20] 20 (10/27 0440) BP: (96-140)/(69-93) 140/92 (10/27 0440) SpO2:  [98 %-100 %] 100 % (10/27 0440) Weight:  [98.1 kg] 98.1 kg (10/26 0835)  Intake/Output from previous day:  Intake/Output Summary (Last 24 hours) at 12/11/2018 0743 Last data filed at 12/11/2018 0634 Gross per 24 hour  Intake 3429.62 ml  Output 1650 ml  Net 1779.62 ml     Intake/Output this shift: No intake/output data recorded.  Labs: Recent Labs    12/11/18 0231  HGB 11.5*   Recent Labs    12/11/18 0231  WBC 12.3*  RBC 4.11  HCT 36.5  PLT 168   Recent Labs    12/11/18 0231  NA 138  K 4.5  CL 104  CO2 25  BUN 14  CREATININE 0.54  GLUCOSE 128*  CALCIUM 9.0   Recent Labs    12/10/18 0810  INR 1.1    Exam: General - Patient is Alert and Oriented Extremity - Neurologically intact Sensation intact distally Intact pulses distally Dorsiflexion/Plantar flexion intact Dressing - dressing C/D/I Motor Function - intact, moving foot and toes well on exam.   Past Medical History:  Diagnosis Date  . Abdominal fibromatosis   . Arthritis   . Atrial fibrillation ([place])   . Breast cancer ([place]) 2002   right lumpectomy  . Chronic constipation   . Diabetes mellitus without complication ([place])    no meds pt. denies ever being tols this  . Diverticulosis   . Dysrhythmia    AFIB  . Endometrial cancer ([place]) 2013  . Epistaxis    FOR  CAUTERY IN NOV.  Marland Kitchen GERD (gastroesophageal reflux disease)   . Heart murmur    pt. denies   . History of adenomatous polyp of colon   . History of kidney stones   . Hyperlipemia   . Hypertension   . Hyperuricemia   . Nose abnormality    BLEEDS, CAUTERIZATION RIGHT SIDE 09/25/17  . Personal history of radiation therapy 2002   right breast ca  . Sleep apnea    cpap    Assessment/Plan: 1 Day Post-Op Procedure(s) (LRB): TOTAL KNEE ARTHROPLASTY (Right) Principal Problem:   OA (osteoarthritis) of knee  Estimated body mass index is 39.55 kg/m as calculated from the following:   Height as of this encounter: 5\' 2"  (1.575 m).   Weight as of this encounter: 98.1 kg. Advance diet Up with therapy D/C IV fluids   Patient's anticipated LOS is less than 2 midnights, meeting these requirements: - Lives within 1 hour of care - Has a competent adult at home to recover with post-op recover - NO history of  - Chronic pain requiring opiods  - Diabetes  - Coronary Artery Disease  - Heart failure  - Heart attack  - Stroke  - DVT/VTE  - Cardiac arrhythmia  - Respiratory Failure/COPD  - Renal failure  - Anemia  - Advanced Liver disease  DVT Prophylaxis -  Xarelto Weight bearing as tolerated. D/C O2 and pulse ox and try on room air. Hemovac pulled without difficulty, will begin therapy today.  Plan is to go Home after hospital stay. Plan for discharge today following 1-2 sessions of therapy as long as she is progressing and meeting goals. Scheduled for OPPT. Follow up in the office in 2 weeks.   Griffith Citron, PA-C Orthopedic Surgery [phone] 12/11/2018, 7:43 AM

## 2018-12-11 NOTE — Progress Notes (Signed)
   12/11/18 1400  PT Visit Information  Last PT Received On 12/11/18  Pt progressing. Would like to practice stairs again. Will see again for continued stair training and safety.  Assistance Needed +1  History of Present Illness s/p R TKA  Precautions  Precautions Fall;Knee  Required Braces or Orthoses Knee Immobilizer - Right  Knee Immobilizer - Right Discontinue once straight leg raise with < 10 degree lag  Restrictions  Weight Bearing Restrictions No  Other Position/Activity Restrictions WBAT  Pain Assessment  Pain Location right knee  Pain Descriptors / Indicators Grimacing;Guarding;Sore  Cognition  Arousal/Alertness Awake/alert  Behavior During Therapy WFL for tasks assessed/performed  Overall Cognitive Status Within Functional Limits for tasks assessed  Bed Mobility  General bed mobility comments in chair  Transfers  Overall transfer level Needs assistance  Equipment used Rolling walker (2 wheeled)  Transfers Sit to/from Stand  Sit to Stand Min guard;Supervision  General transfer comment cues for hand placement and RLE position  Ambulation/Gait  Ambulation/Gait assistance Min guard;Supervision  Gait Distance (Feet) 40 Feet  Assistive device Rolling walker (2 wheeled)  Gait Pattern/deviations Step-to pattern  General Gait Details cues for sequence and safety  Stairs Yes  Stairs assistance Min assist  Stair Management One rail Right;One rail Left;Step to pattern;Sideways  Number of Stairs 5 (x2)  General stair comments cues for safe technique, sequence   PT - End of Session  Equipment Utilized During Treatment Gait belt (IND SLRs)  Activity Tolerance No increased pain;Patient tolerated treatment well  Patient left in chair;with call bell/phone within reach;with chair alarm set   PT - Assessment/Plan  PT Plan Current plan remains appropriate  PT Visit Diagnosis Difficulty in walking, not elsewhere classified (R26.2)  PT Frequency (ACUTE ONLY) 7X/week  Follow Up  Recommendations Follow surgeon's recommendation for DC plan and follow-up therapies  PT equipment Rolling walker with 5" wheels;3in1 (PT)  AM-PAC PT "6 Clicks" Mobility Outcome Measure (Version 2)  Help needed turning from your back to your side while in a flat bed without using bedrails? 3  Help needed moving from lying on your back to sitting on the side of a flat bed without using bedrails? 3  Help needed moving to and from a bed to a chair (including a wheelchair)? 3  Help needed standing up from a chair using your arms (e.g., wheelchair or bedside chair)? 3  Help needed to walk in hospital room? 3  Help needed climbing 3-5 steps with a railing?  3  6 Click Score 18  Consider Recommendation of Discharge To: Home with [facility]  PT Goal Progression  Progress towards PT goals Progressing toward goals  Acute Rehab PT Goals  PT Goal Formulation With patient  Time For Goal Achievement 12/17/18  Potential to Achieve Goals Good  PT Time Calculation  PT Start Time (ACUTE ONLY) 1315  PT Stop Time (ACUTE ONLY) 1335  PT Time Calculation (min) (ACUTE ONLY) 20 min  PT General Charges  $$ ACUTE PT VISIT 1 Visit  PT Treatments  $Gait Training 8-22 mins

## 2018-12-11 NOTE — Progress Notes (Signed)
Physical Therapy Treatment Patient Details Name: Brooke Krueger MRN: [id number] DOB: 10/31/1940 Today's Date: 12/11/2018    History of Present Illness s/p R TKA    PT Comments    Pt progressing well. Will see for a second  Session to review stairs.  Follow Up Recommendations  Follow surgeon's recommendation for DC plan and follow-up therapies     Equipment Recommendations  Rolling walker with 5" wheels;3in1 (PT)    Recommendations for Other Services       Precautions / Restrictions Precautions Precautions: Fall;Knee Required Braces or Orthoses: Knee Immobilizer - Right Knee Immobilizer - Right: Discontinue once straight leg raise with < 10 degree lag Restrictions Weight Bearing Restrictions: No Other Position/Activity Restrictions: WBAT    Mobility  Bed Mobility               General bed mobility comments: in chair  Transfers Overall transfer level: Needs assistance Equipment used: Rolling walker (2 wheeled) Transfers: Sit to/from Stand Sit to Stand: Min assist         General transfer comment: cues for hand placement and RLE position  Ambulation/Gait Ambulation/Gait assistance: Min assist;Min guard   Assistive device: Rolling walker (2 wheeled) Gait Pattern/deviations: Step-to pattern     General Gait Details: cues for sequence and safety   Stairs             Wheelchair Mobility    Modified Rankin (Stroke Patients Only)       Balance                                            Cognition Arousal/Alertness: Awake/alert Behavior During Therapy: WFL for tasks assessed/performed Overall Cognitive Status: Within Functional Limits for tasks assessed                                        Exercises Total Joint Exercises Ankle Circles/Pumps: AROM;Both;10 reps Quad Sets: AROM;Both;10 reps Short Arc Quad: AROM;Right;10 reps Heel Slides: AAROM;Right;10 reps Hip ABduction/ADduction: AROM;Right;10  reps Straight Leg Raises: AROM;Right;10 reps Long Arc Quad: AROM;Right;10 reps Goniometric ROM: grossly 7 to 90 degrees flexion right knee    General Comments        Pertinent Vitals/Pain Pain Location: right knee Pain Descriptors / Indicators: Grimacing;Guarding;Sore    Home Living                      Prior Function            PT Goals (current goals can now be found in the care plan section) Acute Rehab PT Goals PT Goal Formulation: With patient Time For Goal Achievement: 12/17/18 Potential to Achieve Goals: Good Progress towards PT goals: Progressing toward goals    Frequency    7X/week      PT Plan Current plan remains appropriate    Co-evaluation              AM-PAC PT "6 Clicks" Mobility   Outcome Measure  Help needed turning from your back to your side while in a flat bed without using bedrails?: A Little Help needed moving from lying on your back to sitting on the side of a flat bed without using bedrails?: A Little Help needed moving to and from a bed to a chair (  including a wheelchair)?: A Little Help needed standing up from a chair using your arms (e.g., wheelchair or bedside chair)?: A Little Help needed to walk in hospital room?: A Little Help needed climbing 3-5 steps with a railing? : A Lot 6 Click Score: 17    End of Session Equipment Utilized During Treatment: Gait belt(IND ) Activity Tolerance: No increased pain;Patient tolerated treatment well Patient left: in chair;with call bell/phone within reach;with chair alarm set   PT Visit Diagnosis: Difficulty in walking, not elsewhere classified (R26.2)     Time: [id number] PT Time Calculation (min) (ACUTE ONLY): 26 min  Charges:  $Gait Training: 8-22 mins $Therapeutic Exercise: 8-22 mins                     Kenyon Ana, PT  Pager: [phone] Acute Rehab Dept [facility]): [phone]   12/11/2018    [facility] 12/11/2018, 2:25 PM

## 2018-12-11 NOTE — Care Management Obs Status (Signed)
[place] NOTIFICATION   Patient Details  Name: Brooke Krueger MRN: [id number] Date of Birth: [birth year]   Medicare Observation Status Notification Given:  Yes    Lia Hopping, LCSW 12/11/2018, 9:34 AM

## 2018-12-11 NOTE — Progress Notes (Signed)
   12/11/18 1430  PT Visit Information  Last PT Received On 12/11/18  Pt progressing well; recommend supervision for mobility initial 24-48 hours; improvement with stair training. Ready for d/c with assist of friend  Assistance Needed +1  History of Present Illness s/p R TKA  Precautions  Precautions Fall;Knee  Required Braces or Orthoses Knee Immobilizer - Right  Knee Immobilizer - Right Discontinue once straight leg raise with < 10 degree lag  Restrictions  Weight Bearing Restrictions No  Other Position/Activity Restrictions WBAT  Pain Assessment  Pain Assessment 0-10  Pain Score 4  Pain Location right knee  Pain Descriptors / Indicators Grimacing;Guarding;Sore  Cognition  Arousal/Alertness Awake/alert  Behavior During Therapy WFL for tasks assessed/performed  Overall Cognitive Status Within Functional Limits for tasks assessed  Bed Mobility  General bed mobility comments in chair  Transfers  Overall transfer level Needs assistance  Equipment used Rolling walker (2 wheeled)  Transfers Sit to/from Stand  Sit to Stand Min guard;Supervision  General transfer comment cues for hand placement and RLE position  Ambulation/Gait  Ambulation/Gait assistance Supervision  Assistive device Rolling walker (2 wheeled)  Gait Pattern/deviations Step-to pattern  General Gait Details cues for sequence and safety  Stairs assistance Min assist  Stair Management One rail Right;One rail Left;Step to pattern;Sideways  Number of Stairs 5 (x2)  General stair comments cues for safe technique, sequence   PT - End of Session  Equipment Utilized During Treatment Gait belt (IND SLRs)  Activity Tolerance No increased pain;Patient tolerated treatment well  Patient left in chair;with call bell/phone within reach;with chair alarm set   PT - Assessment/Plan  PT Plan Current plan remains appropriate  PT Visit Diagnosis Difficulty in walking, not elsewhere classified (R26.2)  PT Frequency (ACUTE ONLY)  7X/week  Follow Up Recommendations Follow surgeon's recommendation for DC plan and follow-up therapies  PT equipment Rolling walker with 5" wheels;3in1 (PT)  AM-PAC PT "6 Clicks" Mobility Outcome Measure (Version 2)  Help needed turning from your back to your side while in a flat bed without using bedrails? 3  Help needed moving from lying on your back to sitting on the side of a flat bed without using bedrails? 3  Help needed moving to and from a bed to a chair (including a wheelchair)? 3  Help needed standing up from a chair using your arms (e.g., wheelchair or bedside chair)? 3  Help needed to walk in hospital room? 3  Help needed climbing 3-5 steps with a railing?  3  6 Click Score 18  Consider Recommendation of Discharge To: Home with [facility]  PT Goal Progression  Progress towards PT goals Progressing toward goals  Acute Rehab PT Goals  PT Goal Formulation With patient  Time For Goal Achievement 12/17/18  Potential to Achieve Goals Good  PT Time Calculation  PT Start Time (ACUTE ONLY) 1342  PT Stop Time (ACUTE ONLY) 1408  PT Time Calculation (min) (ACUTE ONLY) 26 min  PT General Charges  $$ ACUTE PT VISIT 1 Visit  PT Treatments  $Gait Training 23-37 mins

## 2018-12-11 NOTE — Care Management CC44 (Signed)
Condition Code 44 Documentation Completed  Patient Details  Name: Brooke Krueger MRN: [id number] Date of Birth: [birth year]   Condition Code 44 given:  Yes Patient signature on Condition Code 44 notice:  Yes Documentation of 2 MD's agreement:  Yes Code 44 added to claim:  Yes    Lia Hopping, LCSW 12/11/2018, 9:34 AM

## 2018-12-11 NOTE — TOC Transition Note (Signed)
Transition of Care [facility]) - CM/SW Discharge Note   Patient Details  Name: Ysidra Kano MRN: [id number] Date of Birth: [birth year]  [facility]) CM/SW Contact:  Lia Hopping, [place] Phone Number: 12/11/2018, 9:49 AM   Clinical Narrative:       Final next level of care: OP Rehab Barriers to Discharge: No Barriers Identified   Patient Goals and CMS Choice     Choice offered to / list presented to : NA  Discharge Placement                       Discharge Plan and Services                DME Arranged: 3-N-1 DME Agency: [employer] Date DME Agency Contacted: 12/11/18 Time DME Agency Contacted: 0900 Representative spoke with at DME Agency: [place] ([place]) Interventions     Readmission Risk Interventions No flowsheet data found.

## 2018-12-12 NOTE — Discharge Summary (Signed)
Physician Discharge Summary   Patient ID: Brooke Krueger MRN: [id number] DOB/AGE: 09/22/1940 78 y.o.  Admit date: 12/10/2018 Discharge date: 12/11/2018  Primary Diagnosis: Osteoarthritis Right knee(s)  Admission Diagnoses:  Past Medical History:  Diagnosis Date  . Abdominal fibromatosis   . Arthritis   . Atrial fibrillation ([place])   . Breast cancer ([place]) 2002   right lumpectomy  . Chronic constipation   . Diabetes mellitus without complication ([place])    no meds pt. denies ever being tols this  . Diverticulosis   . Dysrhythmia    AFIB  . Endometrial cancer ([place]) 2013  . Epistaxis    FOR CAUTERY IN NOV.  Marland Kitchen GERD (gastroesophageal reflux disease)   . Heart murmur    pt. denies   . History of adenomatous polyp of colon   . History of kidney stones   . Hyperlipemia   . Hypertension   . Hyperuricemia   . Nose abnormality    BLEEDS, CAUTERIZATION RIGHT SIDE 09/25/17  . Personal history of radiation therapy 2002   right breast ca  . Sleep apnea    cpap   Discharge Diagnoses:   Principal Problem:   OA (osteoarthritis) of knee  Estimated body mass index is 39.55 kg/m as calculated from the following:   Height as of this encounter: 5\' 2"  (1.575 m).   Weight as of this encounter: 98.1 kg.  Procedure:  Procedure(s) (LRB): TOTAL KNEE ARTHROPLASTY (Right)   Consults: None  HPI: Brooke Krueger is a 78 y.o. year old female with end stage OA of her right knee with progressively worsening pain and dysfunction. She has constant pain, with activity and at rest and significant functional deficits with difficulties even with ADLs. She has had extensive non-op management including analgesics, injections of cortisone and viscosupplements, and home exercise program, but remains in significant pain with significant dysfunction.Radiographs show bone on bone arthritis medial and patellofemoral. She presents now for right Total Knee Arthroplasty.    Laboratory Data: Admission on  12/10/2018, Discharged on 12/11/2018  Component Date Value Ref Range Status  . ABO/RH(D) 12/10/2018 A POS   Final  . Antibody Screen 12/10/2018 NEG   Final  . Sample Expiration 12/10/2018    Final                   Value:12/13/2018,2359 Performed at [facility], [place] [address], [place], [place] [address]   . Prothrombin Time 12/10/2018 14.1  11.4 - 15.2 seconds Final  . INR 12/10/2018 1.1  0.8 - 1.2 Final   Comment: (NOTE) INR goal varies based on device and disease states. Performed at [facility], [place] [address] [place], [place] [address]   . aPTT 12/10/2018 32  24 - 36 seconds Final   Performed at [facility], [place] [address] [place], [place] [address]  . Glucose-Capillary 12/10/2018 102* 70 - 99 mg/dL Final  . Glucose-Capillary 12/10/2018 103* 70 - 99 mg/dL Final  . Comment 1 12/10/2018 Notify RN   Final  . WBC 12/11/2018 12.3* 4.0 - 10.5 K/uL Final  . RBC 12/11/2018 4.11  3.87 - 5.11 MIL/uL Final  . Hemoglobin 12/11/2018 11.5* 12.0 - 15.0 g/dL Final  . HCT 12/11/2018 36.5  36.0 - 46.0 % Final  . MCV 12/11/2018 88.8  80.0 - 100.0 fL Final  . MCH 12/11/2018 28.0  26.0 - 34.0 pg Final  . MCHC 12/11/2018 31.5  30.0 - 36.0 g/dL Final  . RDW 12/11/2018  14.8  11.5 - 15.5 % Final  . Platelets 12/11/2018 168  150 - 400 K/uL Final  . nRBC 12/11/2018 0.0  0.0 - 0.2 % Final   Performed at [facility], [place] [address], [place], [place] [address]  . Sodium 12/11/2018 138  135 - 145 mmol/L Final  . Potassium 12/11/2018 4.5  3.5 - 5.1 mmol/L Final  . Chloride 12/11/2018 104  98 - 111 mmol/L Final  . CO2 12/11/2018 25  22 - 32 mmol/L Final  . Glucose, Bld 12/11/2018 128* 70 - 99 mg/dL Final  . BUN 12/11/2018 14  8 - 23 mg/dL Final  . Creatinine, Ser 12/11/2018 0.54  0.44 - 1.00 mg/dL Final  . Calcium 12/11/2018 9.0  8.9 - 10.3 mg/dL Final  . GFR calc non Af Amer 12/11/2018 >60  >60 mL/min Final  . GFR calc Af  Amer 12/11/2018 >60  >60 mL/min Final  . Anion gap 12/11/2018 9  5 - 15 Final   Performed at [facility], [place] [address], [place], [place] [address]  Hospital Outpatient Visit on 12/06/2018  Component Date Value Ref Range Status  . SARS-CoV-2, NAA 12/06/2018 NOT DETECTED  NOT DETECTED Final   Comment: (NOTE) This nucleic acid amplification test was developed and its performance characteristics determined by [employer]. Nucleic acid amplification tests include PCR and TMA. This test has not been FDA cleared or approved. This test has been authorized by FDA under an Emergency Use Authorization (EUA). This test is only authorized for the duration of time the declaration that circumstances exist justifying the authorization of the emergency use of in vitro diagnostic tests for detection of SARS-CoV-2 virus and/or diagnosis of COVID-19 infection under section 564(b)(1) of the Act, 21 U.S.C. [id number]) (1), unless the authorization is terminated or revoked sooner. When diagnostic testing is negative, the possibility of a false negative result should be considered in the context of a patient's recent exposures and the presence of clinical signs and symptoms consistent with COVID-19. An individual without symptoms of COVID- 19 and who is not shedding SARS-CoV-2 vi                          rus would expect to have a negative (not detected) result in this assay. Performed At: [facility] [place], [place] [id number] Rush Farmer MD [id number]   . Coronavirus Source 12/06/2018 NASOPHARYNGEAL   Final   Performed at [facility], [place] [address] [place], [place] [address]  Hospital Outpatient Visit on 12/04/2018  Component Date Value Ref Range Status  . aPTT 12/04/2018 40* 24 - 36 seconds Final   Comment:        IF BASELINE aPTT IS ELEVATED, SUGGEST PATIENT RISK ASSESSMENT BE USED TO DETERMINE APPROPRIATE ANTICOAGULANT THERAPY.  Performed at [facility], [place] [address], [place], [place] [address]   . WBC 12/04/2018 7.9  4.0 - 10.5 K/uL Final  . RBC 12/04/2018 4.84  3.87 - 5.11 MIL/uL Final  . Hemoglobin 12/04/2018 13.5  12.0 - 15.0 g/dL Final  . HCT 12/04/2018 43.5  36.0 - 46.0 % Final  . MCV 12/04/2018 89.9  80.0 - 100.0 fL Final  . MCH 12/04/2018 27.9  26.0 - 34.0 pg Final  . MCHC 12/04/2018 31.0  30.0 - 36.0 g/dL Final  . RDW 12/04/2018 14.8  11.5 - 15.5 % Final  . Platelets 12/04/2018 198  150 - 400  K/uL Final  . nRBC 12/04/2018 0.0  0.0 - 0.2 % Final  . Neutrophils Relative % 12/04/2018 64  % Final  . Neutro Abs 12/04/2018 5.0  1.7 - 7.7 K/uL Final  . Lymphocytes Relative 12/04/2018 27  % Final  . Lymphs Abs 12/04/2018 2.2  0.7 - 4.0 K/uL Final  . Monocytes Relative 12/04/2018 7  % Final  . Monocytes Absolute 12/04/2018 0.6  0.1 - 1.0 K/uL Final  . Eosinophils Relative 12/04/2018 2  % Final  . Eosinophils Absolute 12/04/2018 0.1  0.0 - 0.5 K/uL Final  . Basophils Relative 12/04/2018 0  % Final  . Basophils Absolute 12/04/2018 0.0  0.0 - 0.1 K/uL Final  . Immature Granulocytes 12/04/2018 0  % Final  . Abs Immature Granulocytes 12/04/2018 0.02  0.00 - 0.07 K/uL Final   Performed at [facility], [place] [address] [place], [place] [address]  . Sodium 12/04/2018 138  135 - 145 mmol/L Final  . Potassium 12/04/2018 3.4* 3.5 - 5.1 mmol/L Final  . Chloride 12/04/2018 99  98 - 111 mmol/L Final  . CO2 12/04/2018 26  22 - 32 mmol/L Final  . Glucose, Bld 12/04/2018 94  70 - 99 mg/dL Final  . BUN 12/04/2018 15  8 - 23 mg/dL Final  . Creatinine, Ser 12/04/2018 0.56  0.44 - 1.00 mg/dL Final  . Calcium 12/04/2018 9.9  8.9 - 10.3 mg/dL Final  . Total Protein 12/04/2018 7.6  6.5 - 8.1 g/dL Final  . Albumin 12/04/2018 4.3  3.5 - 5.0 g/dL Final  . AST 12/04/2018 20  15 - 41 U/L Final  . ALT 12/04/2018 17  0 - 44 U/L Final  . Alkaline Phosphatase 12/04/2018 85  38 - 126 U/L Final   . Total Bilirubin 12/04/2018 1.0  0.3 - 1.2 mg/dL Final  . GFR calc non Af Amer 12/04/2018 >60  >60 mL/min Final  . GFR calc Af Amer 12/04/2018 >60  >60 mL/min Final  . Anion gap 12/04/2018 13  5 - 15 Final   Performed at [facility], [place] [address] [place], [place] [address]  . Prothrombin Time 12/04/2018 17.5* 11.4 - 15.2 seconds Final  . INR 12/04/2018 1.5* 0.8 - 1.2 Final   Comment: (NOTE) INR goal varies based on device and disease states. Performed at [facility], [place] [address], [place], [place] [address]   . Hgb A1c MFr Bld 12/04/2018 5.8* 4.8 - 5.6 % Final   Comment: (NOTE) Pre diabetes:          5.7%-6.4% Diabetes:              >6.4% Glycemic control for   <7.0% adults with diabetes   . Mean Plasma Glucose 12/04/2018 119.76  mg/dL Final   Performed at [place] [address] [place], [place] [address]  . Glucose-Capillary 12/04/2018 84  70 - 99 mg/dL Final  . MRSA, PCR 12/04/2018 NEGATIVE  NEGATIVE Final  . Staphylococcus aureus 12/04/2018 NEGATIVE  NEGATIVE Final   Comment: (NOTE) The Xpert SA Assay (FDA approved for NASAL specimens in patients 44 years of age and older), is one component of a comprehensive surveillance program. It is not intended to diagnose infection nor to guide or monitor treatment. Performed at [facility], [place] [address], [place], [place] [address]      X-Rays:No results found.  EKG:No orders found for this or any previous visit.   Hospital Course: Brooke Jervey  Krueger is a 78 y.o. who was admitted to [facility]. They were brought to the operating room on 12/10/2018 and underwent Procedure(s): TOTAL KNEE ARTHROPLASTY.  Patient tolerated the procedure well and was later transferred to the recovery room and then to the orthopaedic floor for postoperative care. They were given PO and IV analgesics for pain control following their surgery. They were given 24 hours of  postoperative antibiotics of  Anti-infectives (From admission, onward)   Start     Dose/Rate Route Frequency Ordered Stop   12/10/18 2230  vancomycin (VANCOCIN) IVPB 1000 mg/200 mL premix     1,000 mg 200 mL/hr over 60 Minutes Intravenous Every 12 hours 12/10/18 1401 12/10/18 2302   12/10/18 0815  vancomycin (VANCOCIN) IVPB 1000 mg/200 mL premix     1,000 mg 200 mL/hr over 60 Minutes Intravenous On call to O.R. 12/10/18 0809 12/10/18 1149     and started on DVT prophylaxis in the form of Xarelto.   PT and OT were ordered for total joint protocol. Discharge planning consulted to help with postop disposition and equipment needs.  Patient had a good night on the evening of surgery. They started to get up OOB with therapy on POD #0. Pt was seen during rounds and was ready to go home pending progress with therapy. Hemovac drain was pulled without difficulty. She worked with therapy on POD #1 and was meeting her goals. Pt was discharged to home later that day in stable condition.  Diet: Regular diet Activity: WBAT Follow-up: in 2 weeks Disposition: Home Discharged Condition: good   Discharge Instructions    Call MD / Call 911   Complete by: As directed    If you experience chest pain or shortness of breath, CALL 911 and be transported to the hospital emergency room.  If you develope a fever above 101 F, pus (white drainage) or increased drainage or redness at the wound, or calf pain, call your surgeon's office.   Change dressing   Complete by: As directed    Change dressing on Wednesday, then change the dressing daily with sterile 4 x 4 inch gauze dressing and apply TED hose.   Constipation Prevention   Complete by: As directed    Drink plenty of fluids.  Prune juice may be helpful.  You may use a stool softener, such as Colace (over the counter) 100 mg twice a day.  Use MiraLax (over the counter) for constipation as needed.   Diet - low sodium heart healthy   Complete by: As directed     Discharge instructions   Complete by: As directed    Dr. Gaynelle Arabian Total Joint Specialist [facility] [address] [place], [place] [address] [phone]  TOTAL KNEE REPLACEMENT POSTOPERATIVE DIRECTIONS  Knee Rehabilitation, Guidelines Following Surgery  Results after knee surgery are often greatly improved when you follow the exercise, range of motion and muscle strengthening exercises prescribed by your doctor. Safety measures are also important to protect the knee from further injury. Any time any of these exercises cause you to have increased pain or swelling in your knee joint, decrease the amount until you are comfortable again and slowly increase them. If you have problems or questions, call your caregiver or physical therapist for advice.   HOME CARE INSTRUCTIONS  Remove items at home which could result in a fall. This includes throw rugs or furniture in walking pathways.  ICE to the affected knee every three hours for 30 minutes at  a time and then as needed for pain and swelling.  Continue to use ice on the knee for pain and swelling from surgery. You may notice swelling that will progress down to the foot and ankle.  This is normal after surgery.  Elevate the leg when you are not up walking on it.   Continue to use the breathing machine which will help keep your temperature down.  It is common for your temperature to cycle up and down following surgery, especially at night when you are not up moving around and exerting yourself.  The breathing machine keeps your lungs expanded and your temperature down. Do not place pillow under knee, focus on keeping the knee straight while resting   DIET You may resume your previous home diet once your are discharged from the hospital.  DRESSING / WOUND CARE / SHOWERING You may change your dressing 3-5 days after surgery.  Then change the dressing every day with sterile gauze.  Please use good hand washing techniques before  changing the dressing.  Do not use any lotions or creams on the incision until instructed by your surgeon. You may start showering once you are discharged home but do not submerge the incision under water. Just pat the incision dry and apply a dry gauze dressing on daily. Change the surgical dressing daily and reapply a dry dressing each time.  ACTIVITY Walk with your walker as instructed. Use walker as long as suggested by your caregivers. Avoid periods of inactivity such as sitting longer than an hour when not asleep. This helps prevent blood clots.  You may resume a sexual relationship in one month or when given the OK by your doctor.  You may return to work once you are cleared by your doctor.  Do not drive a car for 6 weeks or until released by you surgeon.  Do not drive while taking narcotics.  WEIGHT BEARING Weight bearing as tolerated with assist device (walker, cane, etc) as directed, use it as long as suggested by your surgeon or therapist, typically at least 4-6 weeks.  POSTOPERATIVE CONSTIPATION PROTOCOL Constipation - defined medically as fewer than three stools per week and severe constipation as less than one stool per week.  One of the most common issues patients have following surgery is constipation.  Even if you have a regular bowel pattern at home, your normal regimen is likely to be disrupted due to multiple reasons following surgery.  Combination of anesthesia, postoperative narcotics, change in appetite and fluid intake all can affect your bowels.  In order to avoid complications following surgery, here are some recommendations in order to help you during your recovery period.  Colace (docusate) - Pick up an over-the-counter form of Colace or another stool softener and take twice a day as long as you are requiring postoperative pain medications.  Take with a full glass of water daily.  If you experience loose stools or diarrhea, hold the colace until you stool forms back  up.  If your symptoms do not get better within 1 week or if they get worse, check with your doctor.  Dulcolax (bisacodyl) - Pick up over-the-counter and take as directed by the product packaging as needed to assist with the movement of your bowels.  Take with a full glass of water.  Use this product as needed if not relieved by Colace only.   MiraLax (polyethylene glycol) - Pick up over-the-counter to have on hand.  MiraLax is a solution that will increase the  amount of water in your bowels to assist with bowel movements.  Take as directed and can mix with a glass of water, juice, soda, coffee, or tea.  Take if you go more than two days without a movement. Do not use MiraLax more than once per day. Call your doctor if you are still constipated or irregular after using this medication for 7 days in a row.  If you continue to have problems with postoperative constipation, please contact the office for further assistance and recommendations.  If you experience "the worst abdominal pain ever" or develop nausea or vomiting, please contact the office immediatly for further recommendations for treatment.  ITCHING  If you experience itching with your medications, try taking only a single pain pill, or even half a pain pill at a time.  You can also use Benadryl over the counter for itching or also to help with sleep.   TED HOSE STOCKINGS Wear the elastic stockings on both legs for three weeks following surgery during the day but you may remove then at night for sleeping.  MEDICATIONS See your medication summary on the "After Visit Summary" that the nursing staff will review with you prior to discharge.  You may have some home medications which will be placed on hold until you complete the course of blood thinner medication.  It is important for you to complete the blood thinner medication as prescribed by your surgeon.  Continue your approved medications as instructed at time of discharge.  PRECAUTIONS If  you experience chest pain or shortness of breath - call 911 immediately for transfer to the hospital emergency department.  If you develop a fever greater that 101 F, purulent drainage from wound, increased redness or drainage from wound, foul odor from the wound/dressing, or calf pain - CONTACT YOUR SURGEON.                                                   FOLLOW-UP APPOINTMENTS Make sure you keep all of your appointments after your operation with your surgeon and caregivers. You should call the office at the above phone number and make an appointment for approximately two weeks after the date of your surgery or on the date instructed by your surgeon outlined in the "After Visit Summary".   RANGE OF MOTION AND STRENGTHENING EXERCISES  Rehabilitation of the knee is important following a knee injury or an operation. After just a few days of immobilization, the muscles of the thigh which control the knee become weakened and shrink (atrophy). Knee exercises are designed to build up the tone and strength of the thigh muscles and to improve knee motion. Often times heat used for twenty to thirty minutes before working out will loosen up your tissues and help with improving the range of motion but do not use heat for the first two weeks following surgery. These exercises can be done on a training (exercise) mat, on the floor, on a table or on a bed. Use what ever works the best and is most comfortable for you Knee exercises include:  Leg Lifts - While your knee is still immobilized in a splint or cast, you can do straight leg raises. Lift the leg to 60 degrees, hold for 3 sec, and slowly lower the leg. Repeat 10-20 times 2-3 times daily. Perform this exercise against resistance later  as your knee gets better.  Quad and Hamstring Sets - Tighten up the muscle on the front of the thigh (Quad) and hold for 5-10 sec. Repeat this 10-20 times hourly. Hamstring sets are done by pushing the foot backward against an  object and holding for 5-10 sec. Repeat as with quad sets.  Leg Slides: Lying on your back, slowly slide your foot toward your buttocks, bending your knee up off the floor (only go as far as is comfortable). Then slowly slide your foot back down until your leg is flat on the floor again. Angel Wings: Lying on your back spread your legs to the side as far apart as you can without causing discomfort.  A rehabilitation program following serious knee injuries can speed recovery and prevent re-injury in the future due to weakened muscles. Contact your doctor or a physical therapist for more information on knee rehabilitation.   IF YOU ARE TRANSFERRED TO A SKILLED REHAB FACILITY If the patient is transferred to a skilled rehab facility following release from the hospital, a list of the current medications will be sent to the facility for the patient to continue.  When discharged from the skilled rehab facility, please have the facility set up the patient's [place] prior to being released. Also, the skilled facility will be responsible for providing the patient with their medications at time of release from the facility to include their pain medication, the muscle relaxants, and their blood thinner medication. If the patient is still at the rehab facility at time of the two week follow up appointment, the skilled rehab facility will also need to assist the patient in arranging follow up appointment in our office and any transportation needs.  MAKE SURE YOU:  Understand these instructions.  Get help right away if you are not doing well or get worse.    Pick up stool softner and laxative for home use following surgery while on pain medications. Do not submerge incision under water. Please use good hand washing techniques while changing dressing each day. May shower starting three days after surgery. Please use a clean towel to pat the incision dry following showers. Continue to use ice  for pain and swelling after surgery. Do not use any lotions or creams on the incision until instructed by your surgeon.   Do not put a pillow under the knee. Place it under the heel.   Complete by: As directed    Driving restrictions   Complete by: As directed    No driving for two weeks   TED hose   Complete by: As directed    Use stockings (TED hose) for three weeks on both leg(s).  You may remove them at night for sleeping.   Weight bearing as tolerated   Complete by: As directed      Allergies as of 12/11/2018      Reactions   Aspirin Anaphylaxis, Other (See Comments)   Closes throat   Combigan [brimonidine Tartrate-timolol] Other (See Comments)   respiratory distress   Penicillins Rash, Other (See Comments)   Has patient had a PCN reaction causing immediate rash, facial/tongue/throat swelling, SOB or lightheadedness with hypotension: yes Has patient had a PCN reaction causing severe rash involving mucus membranes or skin necrosis: no Has patient had a PCN reaction that required hospitalization: no Has patient had a PCN reaction occurring within the last 10 years: no If all of the above answers are "NO", then may proceed with Cephalosporin use.  Medication List    TAKE these medications   acetaminophen 500 MG tablet Commonly known as: TYLENOL Take 1,000 mg by mouth every 6 (six) hours as needed (pain/headaches.).   atorvastatin 20 MG tablet Commonly known as: LIPITOR Take 20 mg by mouth every evening.   calcium-vitamin D 500-200 MG-UNIT tablet Commonly known as: OSCAL WITH D Take 1 tablet by mouth daily with breakfast.   Cranberry 500 MG Tabs Take 500 mg by mouth daily.   [employer] D3 50 MCG (2000 UT) Caps Generic drug: Cholecalciferol Take 2,000 Units by mouth daily.   docusate sodium 100 MG capsule Commonly known as: COLACE Take 100 mg by mouth See admin instructions. Take 1 capsule (100 mg) by mouth scheduled in the morning, may take an additional dose in the  evening if needed for constipation.   gabapentin 100 MG capsule Commonly known as: NEURONTIN Take 1 capsule (100 mg total) by mouth 3 (three) times daily. Take a 100 mg capsule three times a day for two weeks following surgery.Then take a 100 mg capsule two times a day for two weeks. Then take a 100 mg capsule once a day for two weeks. Then discontinue the Gabapentin.   hydrochlorothiazide 25 MG tablet Commonly known as: HYDRODIURIL Take 25 mg by mouth daily.   Lumigan 0.01 % Soln Generic drug: bimatoprost Place 1 drop into both eyes at bedtime.   methocarbamol 500 MG tablet Commonly known as: ROBAXIN Take 1 tablet (500 mg total) by mouth every 6 (six) hours as needed for muscle spasms.   metoprolol succinate 50 MG 24 hr tablet Commonly known as: TOPROL-XL Take 50 mg by mouth daily.   oxybutynin 15 MG 24 hr tablet Commonly known as: DITROPAN XL Take 15 mg by mouth daily.   oxyCODONE 5 MG immediate release tablet Commonly known as: Oxy IR/ROXICODONE Take 1-2 tablets (5-10 mg total) by mouth every 6 (six) hours as needed for severe pain.   traMADol 50 MG tablet Commonly known as: ULTRAM Take 1-2 tablets (50-100 mg total) by mouth every 6 (six) hours as needed for moderate pain. What changed:   how much to take  reasons to take this   Xarelto 20 MG Tabs tablet Generic drug: rivaroxaban Take 20 mg by mouth daily with supper.            Discharge Care Instructions  (From admission, onward)         Start     Ordered   12/11/18 0000  Weight bearing as tolerated     12/11/18 0747   12/11/18 0000  Change dressing    Comments: Change dressing on Wednesday, then change the dressing daily with sterile 4 x 4 inch gauze dressing and apply TED hose.   12/11/18 0747         Follow-up Information    Gaynelle Arabian, MD. Schedule an appointment as soon as possible for a visit on 12/25/2018.   Specialty: Orthopedic Surgery Contact information: [address] [place]  [place] [address] [id number]           Signed: Griffith Citron, PA-C Orthopedic Surgery 12/12/2018, 2:42 PM

## 2019-02-12 ENCOUNTER — Other Ambulatory Visit: Payer: Self-pay | Admitting: Infectious Diseases

## 2019-02-12 DIAGNOSIS — R310 Gross hematuria: Secondary | ICD-10-CM

## 2019-02-22 ENCOUNTER — Ambulatory Visit
Admission: RE | Admit: 2019-02-22 | Discharge: 2019-02-22 | Disposition: A | Discharge status: Home / Self Care | Payer: Medicare Other | Source: Ambulatory Visit | Attending: Infectious Diseases | Admitting: Infectious Diseases

## 2019-02-22 ENCOUNTER — Other Ambulatory Visit: Payer: Self-pay

## 2019-02-22 DIAGNOSIS — R310 Gross hematuria: Secondary | ICD-10-CM

## 2019-02-26 ENCOUNTER — Ambulatory Visit: Payer: Medicare Other

## 2019-03-06 ENCOUNTER — Ambulatory Visit: Payer: Medicare Other | Attending: Internal Medicine

## 2019-03-06 DIAGNOSIS — Z23 Encounter for immunization: Secondary | ICD-10-CM

## 2019-03-06 NOTE — Progress Notes (Signed)
   Covid-19 Vaccination Clinic  Name:  Brooke Krueger    MRN: [id number] DOB: 11-25-1940  03/06/2019  [name] was observed post Covid-19 immunization for 15 minutes without incidence. She was provided with Vaccine Information Sheet and instruction to access the V-Safe system.   [name] was instructed to call 911 with any severe reactions post vaccine: Marland Kitchen Difficulty breathing  . Swelling of your face and throat  . A fast heartbeat  . A bad rash all over your body  . Dizziness and weakness    Immunizations Administered    Name Date Dose VIS Date Route   Pfizer COVID-19 Vaccine 03/06/2019  5:11 PM 0.3 mL 01/25/2019 Intramuscular   Manufacturer: [place]   Lot: [id number]   [place]: [id number]

## 2019-03-25 ENCOUNTER — Ambulatory Visit: Payer: Medicare Other | Attending: Internal Medicine

## 2019-03-25 DIAGNOSIS — Z23 Encounter for immunization: Secondary | ICD-10-CM | POA: Insufficient documentation

## 2019-03-25 NOTE — Progress Notes (Signed)
   Covid-19 Vaccination Clinic  Name:  Brooke Krueger    MRN: [id number] DOB: 1940-09-11  03/25/2019  [name] was observed post Covid-19 immunization for 30 minutes based on pre-vaccination screening without incidence. She was provided with Vaccine Information Sheet and instruction to access the V-Safe system.   [name] was instructed to call 911 with any severe reactions post vaccine: Marland Kitchen Difficulty breathing  . Swelling of your face and throat  . A fast heartbeat  . A bad rash all over your body  . Dizziness and weakness    Immunizations Administered    Name Date Dose VIS Date Route   Pfizer COVID-19 Vaccine 03/25/2019 11:28 AM 0.3 mL 01/25/2019 Intramuscular   Manufacturer: [place]   Lot: [id number]   [place]: [id number]

## 2019-03-27 ENCOUNTER — Encounter: Payer: Self-pay | Admitting: Urology

## 2019-03-27 ENCOUNTER — Ambulatory Visit (INDEPENDENT_AMBULATORY_CARE_PROVIDER_SITE_OTHER): Payer: Medicare Other | Admitting: Urology

## 2019-03-27 ENCOUNTER — Other Ambulatory Visit: Payer: Self-pay

## 2019-03-27 VITALS — BP 120/77 | HR 80 | Ht 62.0 in | Wt 210.0 lb

## 2019-03-27 DIAGNOSIS — N201 Calculus of ureter: Secondary | ICD-10-CM

## 2019-03-27 DIAGNOSIS — Z87440 Personal history of urinary (tract) infections: Secondary | ICD-10-CM | POA: Diagnosis not present

## 2019-03-27 DIAGNOSIS — R3129 Other microscopic hematuria: Secondary | ICD-10-CM | POA: Diagnosis not present

## 2019-03-27 NOTE — Progress Notes (Signed)
03/27/2019 1:37 PM   [facility]urisaday [facility] 11-07-1940 [id number]  Referring provider: Leonel Ramsay, MD [place],  [place] [address]  Chief Complaint  Patient presents with  . Nephrolithiasis    HPI: 79 year old who presents today for further evaluation of a left distal ureteral calculus.  She was previously seen in 2018 for recurrent urinary tract infections.  She was doing really well and not having issues until recently.    She had a knee replacement in late October 2020.  This was uncomplicated.  Soon after this, she developed urinary tract symptoms including dysuria urgency frequency and found to have a urinary tract infection treated by her primary care physician 12/24/2018.  Her urine at this time was frankly positive.  She grew Proteus was treated and her symptoms resolved.  In January, she developed recurrent similar symptoms including urgency frequency and should she felt could be possibly related to infection.  She also felt that her urine was darker and was concerned about blood.  She did in fact have microscopic hematuria on 02/18/2019 in the absence of infection.  She ultimately underwent a CT scan, noncontrast of the abdomen pelvis on 02/22/2019 which indicated 2 mm left UVJ stone without evidence of obstruction.   Her pain and bladder symptoms resolved.  She reports that she was having some low back pain which actually lateralize to the right as well as urinary symptoms.  These have since resolved completely.  Follow-up urinalysis done at her primary care physicians on 03/25/2019 showed no evidence of ongoing microscopic hematuria, completely negative urinalysis.  She never saw the stone pass.  Remote history of smoking.  PMH: Past Medical History:  Diagnosis Date  . Abdominal fibromatosis   . Arthritis   . Atrial fibrillation ([place])   . Breast cancer ([place]) 2002   right lumpectomy  . Chronic constipation   . Diabetes mellitus without complication  ([place])    no meds pt. denies ever being tols this  . Diverticulosis   . Dysrhythmia    AFIB  . Endometrial cancer ([place]) 2013  . Epistaxis    FOR CAUTERY IN NOV.  Marland Kitchen GERD (gastroesophageal reflux disease)   . Heart murmur    pt. denies   . History of adenomatous polyp of colon   . History of kidney stones   . Hyperlipemia   . Hypertension   . Hyperuricemia   . Nose abnormality    BLEEDS, CAUTERIZATION RIGHT SIDE 09/25/17  . Personal history of radiation therapy 2002   right breast ca  . Sleep apnea    cpap    Surgical History: Past Surgical History:  Procedure Laterality Date  . ABDOMINAL HYSTERECTOMY    . APPENDECTOMY    . BREAST BIOPSY Right 2002   positive  . BREAST LUMPECTOMY Right 2002   radiation only  . CATARACT EXTRACTION W/PHACO Right 10/10/2017   Procedure: CATARACT EXTRACTION PHACO AND INTRAOCULAR LENS PLACEMENT (IOC);  Surgeon: Birder Robson, MD;  Location: [facility] ORS;  Service: Ophthalmology;  Laterality: Right;  [place] 00:46 AP% 13.0 CDE 6.06 Fluid pack lot # [id number]  . CATARACT EXTRACTION W/PHACO Left 12/19/2017   Procedure: CATARACT EXTRACTION PHACO AND INTRAOCULAR LENS PLACEMENT (IOC);  Surgeon: Birder Robson, MD;  Location: [facility] ORS;  Service: Ophthalmology;  Laterality: Left;  [place]  00:41 CDE 6.66 Fluid pack lot # [id number]  . COLONOSCOPY    . COLONOSCOPY WITH PROPOFOL N/A 07/07/2017   Procedure: COLONOSCOPY WITH PROPOFOL;  Surgeon: Manya Silvas,  MD;  Location: [facility] ENDOSCOPY;  Service: Endoscopy;  Laterality: N/A;  . OOPHORECTOMY    . THORACOSCOPY  1988   hematoma  . TONSILLECTOMY    . TOTAL KNEE ARTHROPLASTY Right 12/10/2018   Procedure: TOTAL KNEE ARTHROPLASTY;  Surgeon: Gaynelle Arabian, MD;  Location: [facility] ORS;  Service: Orthopedics;  Laterality: Right;  64min    Home Medications:  Allergies as of 03/27/2019      Reactions   Aspirin Anaphylaxis, Other (See Comments)   Closes throat   Combigan [brimonidine Tartrate-timolol] Other (See  Comments)   respiratory distress   Penicillins Rash, Other (See Comments)   Has patient had a PCN reaction causing immediate rash, facial/tongue/throat swelling, SOB or lightheadedness with hypotension: yes Has patient had a PCN reaction causing severe rash involving mucus membranes or skin necrosis: no Has patient had a PCN reaction that required hospitalization: no Has patient had a PCN reaction occurring within the last 10 years: no If all of the above answers are "NO", then may proceed with Cephalosporin use.      Medication List       Accurate as of March 27, 2019  1:37 PM. If you have any questions, ask your nurse or doctor.        STOP taking these medications   gabapentin 100 MG capsule Commonly known as: NEURONTIN Stopped by: Hollice Espy, MD   methocarbamol 500 MG tablet Commonly known as: ROBAXIN Stopped by: Hollice Espy, MD   oxyCODONE 5 MG immediate release tablet Commonly known as: Oxy IR/ROXICODONE Stopped by: Hollice Espy, MD   traMADol 50 MG tablet Commonly known as: ULTRAM Stopped by: Hollice Espy, MD     TAKE these medications   acetaminophen 500 MG tablet Commonly known as: TYLENOL Take 1,000 mg by mouth every 6 (six) hours as needed (pain/headaches.).   atorvastatin 20 MG tablet Commonly known as: LIPITOR Take 20 mg by mouth every evening.   calcium-vitamin D 500-200 MG-UNIT tablet Commonly known as: OSCAL WITH D Take 1 tablet by mouth daily with breakfast.   Cranberry 500 MG Tabs Take 500 mg by mouth daily.   [employer] D3 50 MCG (2000 UT) Caps Generic drug: Cholecalciferol Take 2,000 Units by mouth daily.   docusate sodium 100 MG capsule Commonly known as: COLACE Take 100 mg by mouth See admin instructions. Take 1 capsule (100 mg) by mouth scheduled in the morning, may take an additional dose in the evening if needed for constipation.   hydrochlorothiazide 25 MG tablet Commonly known as: HYDRODIURIL Take 25 mg by mouth daily.     Lumigan 0.01 % Soln Generic drug: bimatoprost Place 1 drop into both eyes at bedtime.   metoprolol succinate 50 MG 24 hr tablet Commonly known as: TOPROL-XL Take 50 mg by mouth daily.   oxybutynin 15 MG 24 hr tablet Commonly known as: DITROPAN XL Take 15 mg by mouth daily.   tamsulosin 0.4 MG Caps capsule Commonly known as: FLOMAX tamsulosin 0.4 mg capsule  TAKE 1 CAPSULE (0.4 MG TOTAL) BY MOUTH ONCE DAILY TAKE 30 MINUTES AFTER SAME MEAL EACH DAY.   Xarelto 20 MG Tabs tablet Generic drug: rivaroxaban Take 20 mg by mouth daily with supper.       Allergies:  Allergies  Allergen Reactions  . Aspirin Anaphylaxis and Other (See Comments)    Closes throat  . Combigan [Brimonidine Tartrate-Timolol] Other (See Comments)    respiratory distress  . Penicillins Rash and Other (See Comments)    Has patient had  a PCN reaction causing immediate rash, facial/tongue/throat swelling, SOB or lightheadedness with hypotension: yes Has patient had a PCN reaction causing severe rash involving mucus membranes or skin necrosis: no Has patient had a PCN reaction that required hospitalization: no Has patient had a PCN reaction occurring within the last 10 years: no If all of the above answers are "NO", then may proceed with Cephalosporin use.     Family History: Family History  Problem Relation Age of Onset  . Breast cancer Mother 51  . Aortic stenosis Mother   . Dementia Mother   . Colon polyps Mother   . Breast cancer Sister 76  . Breast cancer Other   . Pancreatic cancer Father   . Pancreatic cancer Brother   . Bladder Cancer Neg Hx   . Prostate cancer Neg Hx   . Kidney cancer Neg Hx     Social History:  reports that she quit smoking about 41 years ago. She quit after 20.00 years of use. She has never used smokeless tobacco. She reports previous alcohol use of about 3.0 standard drinks of alcohol per week. She reports that she does not use drugs.   Physical Exam: BP 120/77    Pulse 80   Ht 5\' 2"  (1.575 m)   Wt 200 lb (90.7 kg)   BMI 36.58 kg/m   Constitutional:  Alert and oriented, No acute distress. HEENT: [place] AT, moist mucus membranes.  Trachea midline, no masses. Cardiovascular: No clubbing, cyanosis, or edema. Respiratory: Normal respiratory effort, no increased work of breathing.. Skin: No rashes, bruises or suspicious lesions. Neurologic: Grossly intact, no focal deficits, moving all 4 extremities. Psychiatric: Normal mood and affect.  Laboratory Data: Lab Results  Component Value Date   WBC 12.3 (H) 12/11/2018   HGB 11.5 (L) 12/11/2018   HCT 36.5 12/11/2018   MCV 88.8 12/11/2018   PLT 168 12/11/2018    Lab Results  Component Value Date   CREATININE 0.54 12/11/2018    Lab Results  Component Value Date   HGBA1C 5.8 (H) 12/04/2018    Urinalysis UAs from care everywhere reviewed-   Pertinent Imaging: Results for orders placed during the hospital encounter of 02/22/19  CT RENAL STONE STUDY   Narrative CLINICAL DATA:  Intermittent gross hematuria with low back pain for 1 month. History of nephrolithiasis. Remote history of right breast cancer. Prior uterine cancer status posthysterectomy in 2013. Appendectomy.  EXAM: CT ABDOMEN AND PELVIS WITHOUT CONTRAST  TECHNIQUE: Multidetector CT imaging of the abdomen and pelvis was performed following the standard protocol without IV contrast.  COMPARISON:  None.  FINDINGS: Lower chest: Surgical sutures noted at the left lung base. No acute abnormality at the lung bases.  Hepatobiliary: Normal liver size. Subcentimeter hypodense segment 4A left liver lobe lesion is too small to characterize. No additional liver lesions. Normal gallbladder with no radiopaque cholelithiasis. No biliary ductal dilatation.  Pancreas: Normal, with no mass or duct dilation.  Spleen: Normal size. No mass.  Adrenals/Urinary Tract: Normal adrenals. Punctate nonobstructing lower right renal stone. Punctate  nonobstructing interpolar and lower left renal stones. No hydronephrosis. Simple 1.2 cm lower left renal cyst. Otherwise no contour deforming renal masses. Normal caliber ureters. There is a 2 mm stone at the left ureterovesical junction. Otherwise no ureteral stones. Normal nondistended bladder.  Stomach/Bowel: Normal non-distended stomach. Normal caliber small bowel with no small bowel wall thickening. Appendectomy. Moderate left colonic diverticulosis, with no large bowel wall thickening or significant pericolonic fat stranding.  Vascular/Lymphatic: Atherosclerotic  nonaneurysmal abdominal aorta. No pathologically enlarged lymph nodes in the abdomen or pelvis.  Reproductive: Status post hysterectomy, with no abnormal findings at the vaginal cuff. No adnexal mass.  Other: No pneumoperitoneum, ascites or focal fluid collection.  Musculoskeletal: No aggressive appearing focal osseous lesions. Marked thoracolumbar spondylosis.  IMPRESSION: 1. Left ureterovesical junction 2 mm stone. Normal caliber ureters. No hydronephrosis. Punctate nonobstructing bilateral nephrolithiasis. 2. Moderate left colonic diverticulosis. 3. Subcentimeter low-attenuation segment 4A left liver lobe lesion, indeterminate, low suspicion. Given the history of malignancy, follow-up CT or MRI abdomen in 3-6 months may be considered. This recommendation follows ACR consensus guidelines: Managing Incidental Findings on Abdominal CT: White Paper of the ACR Incidental Findings Committee. J Am Coll Radiol 2010;7:754-773 4.  Aortic Atherosclerosis (ICD10-I70.0).   Electronically Signed   By: Ilona Sorrel M.D.   On: 02/22/2019 14:59    CT scan personally reviewed today, agree with radiologic interpretation.  Assessment & Plan:    1. Left ureteral stone 2 mm left UVJ stone on CT scan  Her symptoms were lower urinary tract symptoms at the time which infection correlates with the location of her stone at the  time of the scan, irritative bladder symptoms.  Given the size and location of the stone it is extremely likely that she is passed this spontaneously and her symptoms are resolved and her urine is cleared.  I am not recommending further imaging at this point in time unless she has issues with recurrent infections or ongoing microscopic blood.  See below.  Warning symptoms reviewed, okay to stop Flomax. - Urinalysis, Complete; Future  2. Microscopic hematuria Presumably related to recent interval stone passage, see above  Most recent urinalysis without blood which is reassuring  Given a remote smoking history, recommend 1 more repeat urinalysis in about 6 weeks to ensure that her urine remains clear to which she is agreeable.  If there is evidence of microscopic blood, may want to pursue microscopic hematuria work-up if at that point in time.  She is agreeable this plan.  3. History of recurrent UTI (urinary tract infection) Isolated urinary tract infection following the surgery, may been catheter related  No recurrence  Advised if she has any signs or symptoms of a urinary tract infection, she should reach out to [place] specifically for UA/urine culture and evaluation.  She has a personal history of recurrent infections in the past but in the recent past more sporadic which is reassuring.   Hollice Espy, MD  [facility] [address] [place], [place] [address] [phone]

## 2019-04-01 ENCOUNTER — Other Ambulatory Visit: Payer: Self-pay | Admitting: Infectious Diseases

## 2019-04-01 DIAGNOSIS — K769 Liver disease, unspecified: Secondary | ICD-10-CM

## 2019-04-01 DIAGNOSIS — Z1231 Encounter for screening mammogram for malignant neoplasm of breast: Secondary | ICD-10-CM

## 2019-04-12 ENCOUNTER — Ambulatory Visit: Payer: Medicare Other

## 2019-05-08 ENCOUNTER — Other Ambulatory Visit: Payer: Medicare Other

## 2019-05-08 ENCOUNTER — Other Ambulatory Visit: Payer: Self-pay

## 2019-05-08 DIAGNOSIS — N201 Calculus of ureter: Secondary | ICD-10-CM

## 2019-05-09 LAB — URINALYSIS, COMPLETE
Bilirubin, UA: NEGATIVE
Glucose, UA: NEGATIVE
Ketones, UA: NEGATIVE
Leukocytes,UA: NEGATIVE
Nitrite, UA: NEGATIVE
Protein,UA: NEGATIVE
Specific Gravity, UA: 1.02 (ref 1.005–1.030)
Urobilinogen, Ur: 0.2 mg/dL (ref 0.2–1.0)
pH, UA: 7 (ref 5.0–7.5)

## 2019-05-09 LAB — MICROSCOPIC EXAMINATION

## 2019-05-10 ENCOUNTER — Telehealth: Payer: Self-pay | Admitting: *Deleted

## 2019-05-10 NOTE — Telephone Encounter (Addendum)
Left patient a VM asked to return call with any questions.  ----- Message from Hollice Espy, MD sent at 05/09/2019  2:39 PM EDT ----- No blood, great news.   Hollice Espy, MD

## 2019-05-13 ENCOUNTER — Ambulatory Visit
Admission: RE | Admit: 2019-05-13 | Discharge: 2019-05-13 | Disposition: A | Discharge status: Home / Self Care | Payer: Medicare Other | Source: Ambulatory Visit | Attending: Infectious Diseases | Admitting: Infectious Diseases

## 2019-05-13 DIAGNOSIS — Z1231 Encounter for screening mammogram for malignant neoplasm of breast: Secondary | ICD-10-CM | POA: Diagnosis not present

## 2019-05-14 LAB — CULTURE, URINE COMPREHENSIVE

## 2019-07-01 ENCOUNTER — Ambulatory Visit: Payer: Medicare Other

## 2019-11-14 NOTE — H&P (Signed)
TOTAL KNEE ADMISSION H&P  Patient is being admitted for left total knee arthroplasty.  Subjective:  Chief Complaint: Left knee pain.  HPI: Brooke Krueger, 79 y.o. female has a history of pain and functional disability in the left knee due to arthritis and has failed non-surgical conservative treatments for greater than 12 weeks to include corticosteriod injections and activity modification. Onset of symptoms was gradual, starting 9 years ago with gradually worsening course since that time. The patient noted no past surgery on the left knee.  Patient currently rates pain in the left knee at 10 out of 10 with activity. Patient has pain that interferes with activities of daily living, pain with passive range of motion and crepitus. Patient has evidence of bone-on-bone arthritis in the medial and patellofemoral compartments with varus deformity by imaging studies. There is no active infection.  Patient Active Problem List   Diagnosis Date Noted  . OA (osteoarthritis) of knee 12/10/2018  . Chronic hyperglycemia 06/20/2014  . Vitamin D deficiency 06/20/2014  . Chronic constipation 04/11/2014  . H/O adenomatous polyp of colon 04/11/2014  . Atrial fibrillation ([place]) 07/18/2013  . Benign essential hypertension 05/16/2013  . Hyperlipidemia, unspecified 05/16/2013  . OSA on CPAP 05/16/2013    Past Medical History:  Diagnosis Date  . Abdominal fibromatosis   . Arthritis   . Atrial fibrillation ([place])   . Breast cancer ([place]) 2002   right lumpectomy  . Chronic constipation   . Diabetes mellitus without complication ([place])    no meds pt. denies ever being tols this  . Diverticulosis   . Dysrhythmia    AFIB  . Endometrial cancer ([place]) 2013  . Epistaxis    FOR CAUTERY IN NOV.  Marland Kitchen GERD (gastroesophageal reflux disease)   . Heart murmur    pt. denies   . History of adenomatous polyp of colon   . History of kidney stones   . Hyperlipemia   . Hypertension   . Hyperuricemia   . Nose  abnormality    BLEEDS, CAUTERIZATION RIGHT SIDE 09/25/17  . Personal history of radiation therapy 2002   right breast ca  . Sleep apnea    cpap    Past Surgical History:  Procedure Laterality Date  . ABDOMINAL HYSTERECTOMY    . APPENDECTOMY    . BREAST BIOPSY Right 2002   positive  . BREAST LUMPECTOMY Right 2002   radiation only  . CATARACT EXTRACTION W/PHACO Right 10/10/2017   Procedure: CATARACT EXTRACTION PHACO AND INTRAOCULAR LENS PLACEMENT (IOC);  Surgeon: Birder Robson, MD;  Location: [facility] ORS;  Service: Ophthalmology;  Laterality: Right;  [place] 00:46 AP% 13.0 CDE 6.06 Fluid pack lot # [id number]  . CATARACT EXTRACTION W/PHACO Left 12/19/2017   Procedure: CATARACT EXTRACTION PHACO AND INTRAOCULAR LENS PLACEMENT (IOC);  Surgeon: Birder Robson, MD;  Location: [facility] ORS;  Service: Ophthalmology;  Laterality: Left;  [place]  00:41 CDE 6.66 Fluid pack lot # [id number]  . COLONOSCOPY    . COLONOSCOPY WITH PROPOFOL N/A 07/07/2017   Procedure: COLONOSCOPY WITH PROPOFOL;  Surgeon: Manya Silvas, MD;  Location: [facility] ENDOSCOPY;  Service: Endoscopy;  Laterality: N/A;  . OOPHORECTOMY    . THORACOSCOPY  1988   hematoma  . TONSILLECTOMY    . TOTAL KNEE ARTHROPLASTY Right 12/10/2018   Procedure: TOTAL KNEE ARTHROPLASTY;  Surgeon: Gaynelle Arabian, MD;  Location: [facility] ORS;  Service: Orthopedics;  Laterality: Right;  58min    Prior to Admission medications   Medication Sig Start Date End Date Taking?  Authorizing Provider  atorvastatin (LIPITOR) 20 MG tablet Take 20 mg by mouth every evening.    Yes [provider]  calcium-vitamin D (OSCAL WITH D) 500-200 MG-UNIT tablet Take 1 tablet by mouth 2 (two) times daily.    Yes [provider]  Cranberry 500 MG TABS Take 500 mg by mouth daily.    Yes [provider]  [facility] D3 2000 units CAPS Take 2,000 Units by mouth daily.  10/30/15  Yes [provider]  docusate sodium (COLACE) 100 MG capsule Take 100 mg by mouth 2  (two) times daily.    Yes [provider]  hydrochlorothiazide (HYDRODIURIL) 25 MG tablet Take 25 mg by mouth daily.  11/23/15  Yes [provider]  LUMIGAN 0.01 % SOLN Place 1 drop into both eyes at bedtime.  11/03/15  Yes [provider]  metoprolol succinate (TOPROL-XL) 50 MG 24 hr tablet Take 50 mg by mouth daily.  10/01/15  Yes [provider]  oxybutynin (DITROPAN XL) 15 MG 24 hr tablet Take 15 mg by mouth daily.  11/24/15  Yes [provider]  traMADol (ULTRAM) 50 MG tablet Take 50-100 mg by mouth every 6 (six) hours as needed for pain. 10/11/19  Yes [provider]  XARELTO 20 MG TABS tablet Take 20 mg by mouth daily with supper.  10/30/15  Yes [provider]  acetaminophen (TYLENOL) 500 MG tablet Take 1,000 mg by mouth every 6 (six) hours as needed (pain/headaches.).  Patient not taking: Reported on 11/11/2019    [provider]  tamsulosin (FLOMAX) 0.4 MG CAPS capsule tamsulosin 0.4 mg capsule  TAKE 1 CAPSULE (0.4 MG TOTAL) BY MOUTH ONCE DAILY TAKE 30 MINUTES AFTER SAME MEAL EACH DAY. Patient not taking: No sig reported 02/25/19   [provider]    Allergies  Allergen Reactions  . Aspirin Anaphylaxis and Other (See Comments)    Closes throat  . Combigan [Brimonidine Tartrate-Timolol] Shortness Of Breath    respiratory distress  . Penicillins Rash and Other (See Comments)    Has patient had a PCN reaction causing immediate rash, facial/tongue/throat swelling, SOB or lightheadedness with hypotension: yes Has patient had a PCN reaction causing severe rash involving mucus membranes or skin necrosis: no Has patient had a PCN reaction that required hospitalization: no Has patient had a PCN reaction occurring within the last 10 years: no If all of the above answers are "NO", then may proceed with Cephalosporin use.     Social History   Socioeconomic History  . Marital status: Single    Spouse name: Not on  file  . Number of children: Not on file  . Years of education: Not on file  . Highest education level: Not on file  Occupational History  . Not on file  Tobacco Use  . Smoking status: Former Smoker    Years: 20.00    Quit date: 07/19/1977    Years since quitting: 42.3  . Smokeless tobacco: Never Used  Vaping Use  . Vaping Use: Never used  Substance and Sexual Activity  . Alcohol use: Not Currently    Alcohol/week: 3.0 standard drinks    Types: 1 Glasses of wine, 1 Cans of beer, 1 Shots of liquor per week    Comment: social  . Drug use: No  . Sexual activity: Not Currently  Other Topics Concern  . Not on file  Social History Narrative  . Not on file   Social Determinants of Health  Financial Resource Strain:   . Difficulty of Paying Living Expenses: Not on file  Food Insecurity:   . Worried About Charity fundraiser in the Last Year: Not on file  . Ran Out of Food in the Last Year: Not on file  Transportation Needs:   . Lack of Transportation (Medical): Not on file  . Lack of Transportation (Non-Medical): Not on file  Physical Activity:   . Days of Exercise per Week: Not on file  . Minutes of Exercise per Session: Not on file  Stress:   . Feeling of Stress : Not on file  Social Connections:   . Frequency of Communication with Friends and Family: Not on file  . Frequency of Social Gatherings with Friends and Family: Not on file  . Attends Religious Services: Not on file  . Active Member of Clubs or Organizations: Not on file  . Attends Archivist Meetings: Not on file  . Marital Status: Not on file  Intimate Partner Violence:   . Fear of Current or Ex-Partner: Not on file  . Emotionally Abused: Not on file  . Physically Abused: Not on file  . Sexually Abused: Not on file      Tobacco Use: Medium Risk  . Smoking Tobacco Use: Former Smoker  . Smokeless Tobacco Use: Never Used   Social History   Substance and Sexual Activity  Alcohol Use Not  Currently  . Alcohol/week: 3.0 standard drinks  . Types: 1 Glasses of wine, 1 Cans of beer, 1 Shots of liquor per week   Comment: social    Family History  Problem Relation Age of Onset  . Breast cancer Mother 77  . Aortic stenosis Mother   . Dementia Mother   . Colon polyps Mother   . Breast cancer Sister 26  . Breast cancer Other   . Pancreatic cancer Father   . Pancreatic cancer Brother   . Bladder Cancer Neg Hx   . Prostate cancer Neg Hx   . Kidney cancer Neg Hx     Review of Systems  Constitutional: Negative for chills and fever.  HENT: Negative for congestion, sore throat and tinnitus.   Eyes: Negative for double vision, photophobia and pain.  Respiratory: Negative for cough, shortness of breath and wheezing.   Cardiovascular: Negative for chest pain, palpitations and orthopnea.  Gastrointestinal: Negative for heartburn, nausea and vomiting.  Genitourinary: Negative for dysuria, frequency and urgency.  Musculoskeletal: Positive for joint pain.  Neurological: Negative for dizziness, weakness and headaches.    Objective:  Physical Exam: Well nourished and well developed.  General: Alert and oriented x3, cooperative and pleasant, no acute distress.  Head: normocephalic, atraumatic, neck supple.  Eyes: EOMI.  Respiratory: breath sounds clear in all fields, no wheezing, rales, or rhonchi. Cardiovascular: Regular rate and rhythm, no murmurs, gallops or rubs.  Abdomen: non-tender to palpation and soft, normoactive bowel sounds. Musculoskeletal:  Left Knee Exam:  Varus deformity. No effusion present. No swelling present. The range of motion is: 5 to 110 degrees.  Significant crepitus on range of motion of the knee.  Positive medial greater than lateral joint line tenderness. The knee is stable.  Calves soft and nontender. Motor function intact in LE. Strength 5/5 LE bilaterally. Neuro: Distal pulses 2+. Sensation to light touch intact in LE.  Imaging  Review Plain radiographs demonstrate severe degenerative joint disease of the left knee. The overall alignment is significant varus. The bone quality appears to be adequate for age and  reported activity level.  Assessment/Plan:  End stage arthritis, left knee   The patient history, physical examination, clinical judgment of the provider and imaging studies are consistent with end stage degenerative joint disease of the left knee and total knee arthroplasty is deemed medically necessary. The treatment options including medical management, injection therapy arthroscopy and arthroplasty were discussed at length. The risks and benefits of total knee arthroplasty were presented and reviewed. The risks due to aseptic loosening, infection, stiffness, patella tracking problems, thromboembolic complications and other imponderables were discussed. The patient acknowledged the explanation, agreed to proceed with the plan and consent was signed. Patient is being admitted for inpatient treatment for surgery, pain control, PT, OT, prophylactic antibiotics, VTE prophylaxis, progressive ambulation and ADLs and discharge planning. The patient is planning to be discharged home.   Patient's anticipated LOS is less than 2 midnights, meeting these requirements: - Younger than 26 - Lives within 1 hour of care - Has a competent adult at home to recover with post-op recover - NO history of  - Chronic pain requiring opiods  - Diabetes  - Coronary Artery Disease  - Heart failure  - Heart attack  - Stroke  - DVT/VTE  - Cardiac arrhythmia  - Respiratory Failure/COPD  - Renal failure  - Anemia  - Advanced Liver disease  Therapy Plans: Outpatient therapy at [facility]) Disposition: Home with friend Planned DVT Prophylaxis: Xarelto 20 mg QD DME Needed: None PCP: Adrian Prows, MD Cardiologist: Bartholome Bill, MD (clearance received) TXA: IV Allergies: PCN (hives), aspirin (anaphylaxis) Anesthesia  Concerns: None BMI: 38.9 Last HgbA1c: Not diabetic [facility]  Other: Gabapentin 100 mg, oxycodone, tramadol for right TKA  - Patient was instructed on what medications to stop prior to surgery. - Follow-up visit in 2 weeks with Dr. Wynelle Link - Begin physical therapy following surgery - Pre-operative lab work as pre-surgical testing - Prescriptions will be provided in hospital at time of discharge  Theresa Duty, PA-C Orthopedic Surgery [facility] [place]

## 2019-11-19 NOTE — Patient Instructions (Addendum)
DUE TO COVID-19 ONLY ONE VISITOR IS ALLOWED TO COME WITH YOU AND STAY IN THE WAITING ROOM ONLY  DURING PRE OP AND PROCEDURE.   IF YOU WILL BE ADMITTED INTO THE HOSPITAL YOU ARE ALLOWED ONE SUPPORT PERSON DURING  VISITATION HOURS ONLY (10AM -8PM)   . The support person may change daily. . The support person must pass our screening, gel in and out, and wear a mask at all times, including in the patient's room. . Patients must also wear a mask when staff or their support person are in the room.   COVID SWAB TESTING MUST BE COMPLETED ON:   Thursday, 11-22-19 @ 11:30 AM   [address] [place],  [address]  (Must self quarantine after testing. Follow instructions on  handout.)        Your procedure is scheduled on:  Monday, 11-25-19   Report to [facility] Main  Entrance    Report to admitting at 7:10 AM   Call this number if you have problems the morning of surgery [phone]   Do not eat food :After Midnight.   May have liquids until 6:30 AM  day of surgery  CLEAR LIQUID DIET  Foods Allowed                                                                     Foods Excluded  Water, Black Coffee and tea, regular and decaf              liquids that you cannot  Plain Jell-O in any flavor  (No red)                                     see through such as: Fruit ices (not with fruit pulp)                                      milk, soups, orange juice              Iced Popsicles (No red)                                      All solid food                                   Apple juices Sports drinks like Gatorade (No red) Lightly seasoned clear broth or consume(fat free) Sugar, honey syrup      Complete one Ensure drink the morning of surgery at 6:30 AM the day of surgery.     Oral Hygiene is also important to reduce your risk of infection.                                    Remember - BRUSH YOUR TEETH THE MORNING OF SURGERY WITH YOUR REGULAR TOOTHPASTE   Do NOT smoke  after  Midnight   Take these medicines the morning of surgery with A SIP OF WATER:  Metoprolol                                You may not have any metal on your body including hair pins, jewelry, and body piercings             Do not wear make-up, lotions, powders, perfumes/cologne, or deodorant             Do not wear nail polish.  Do not shave  48 hours prior to surgery.         Do not bring valuables to the hospital. [place].   Contacts, dentures or bridgework may not be worn into surgery.   Bring small overnight bag day of surgery.               Please read over the following fact sheets you were given: IF YOU HAVE QUESTIONS ABOUT YOUR PRE OP INSTRUCTIONS PLEASE CALL [phone]   [facility] - Preparing for Surgery Before surgery, you can play an important role.  Because skin is not sterile, your skin needs to be as free of germs as possible.  You can reduce the number of germs on your skin by washing with CHG (chlorahexidine gluconate) soap before surgery.  CHG is an antiseptic cleaner which kills germs and bonds with the skin to continue killing germs even after washing. Please DO NOT use if you have an allergy to CHG or antibacterial soaps.  If your skin becomes reddened/irritated stop using the CHG and inform your nurse when you arrive at Short Stay. Do not shave (including legs and underarms) for at least 48 hours prior to the first CHG shower.  You may shave your face/neck.  Please follow these instructions carefully:  1.  Shower with CHG Soap the night before surgery and the  morning of surgery.  2.  If you choose to wash your hair, wash your hair first as usual with your normal  shampoo.  3.  After you shampoo, rinse your hair and body thoroughly to remove the shampoo.                             4.  Use CHG as you would any other liquid soap.  You can apply chg directly to the skin and wash.  Gently with a scrungie or clean  washcloth.  5.  Apply the CHG Soap to your body ONLY FROM THE NECK DOWN.   Do   not use on face/ open                           Wound or open sores. Avoid contact with eyes, ears mouth and   genitals (private parts).                       Wash face,  Genitals (private parts) with your normal soap.             6.  Wash thoroughly, paying special attention to the area where your    surgery  will be performed.  7.  Thoroughly rinse your body with warm water from the neck down.  8.  DO NOT shower/wash with your normal soap  after using and rinsing off the CHG Soap.                9.  Pat yourself dry with a clean towel.            10.  Wear clean pajamas.            11.  Place clean sheets on your bed the night of your first shower and do not  sleep with pets. Day of Surgery : Do not apply any lotions/deodorants the morning of surgery.  Please wear clean clothes to the hospital/surgery center.  FAILURE TO FOLLOW THESE INSTRUCTIONS MAY RESULT IN THE CANCELLATION OF YOUR SURGERY  PATIENT SIGNATURE_________________________________  NURSE SIGNATURE__________________________________  ________________________________________________________________________   Adam Phenix  An incentive spirometer is a tool that can help keep your lungs clear and active. This tool measures how well you are filling your lungs with each breath. Taking long deep breaths may help reverse or decrease the chance of developing breathing (pulmonary) problems (especially infection) following:  A long period of time when you are unable to move or be active. BEFORE THE PROCEDURE   If the spirometer includes an indicator to show your best effort, your nurse or respiratory therapist will set it to a desired goal.  If possible, sit up straight or lean slightly forward. Try not to slouch.  Hold the incentive spirometer in an upright position. INSTRUCTIONS FOR USE  1. Sit on the edge of your bed if possible, or sit up  as far as you can in bed or on a chair. 2. Hold the incentive spirometer in an upright position. 3. Breathe out normally. 4. Place the mouthpiece in your mouth and seal your lips tightly around it. 5. Breathe in slowly and as deeply as possible, raising the piston or the ball toward the top of the column. 6. Hold your breath for 3-5 seconds or for as long as possible. Allow the piston or ball to fall to the bottom of the column. 7. Remove the mouthpiece from your mouth and breathe out normally. 8. Rest for a few seconds and repeat Steps 1 through 7 at least 10 times every 1-2 hours when you are awake. Take your time and take a few normal breaths between deep breaths. 9. The spirometer may include an indicator to show your best effort. Use the indicator as a goal to work toward during each repetition. 10. After each set of 10 deep breaths, practice coughing to be sure your lungs are clear. If you have an incision (the cut made at the time of surgery), support your incision when coughing by placing a pillow or rolled up towels firmly against it. Once you are able to get out of bed, walk around indoors and cough well. You may stop using the incentive spirometer when instructed by your caregiver.  RISKS AND COMPLICATIONS  Take your time so you do not get dizzy or light-headed.  If you are in pain, you may need to take or ask for pain medication before doing incentive spirometry. It is harder to take a deep breath if you are having pain. AFTER USE  Rest and breathe slowly and easily.  It can be helpful to keep track of a log of your progress. Your caregiver can provide you with a simple table to help with this. If you are using the spirometer at home, follow these instructions: [place] IF:   You are having difficultly using the spirometer.  You have  trouble using the spirometer as often as instructed.  Your pain medication is not giving enough relief while using the  spirometer.  You develop fever of 100.5 F (38.1 C) or higher. SEEK IMMEDIATE MEDICAL CARE IF:   You cough up bloody sputum that had not been present before.  You develop fever of 102 F (38.9 C) or greater.  You develop worsening pain at or near the incision site. MAKE SURE YOU:   Understand these instructions.  Will watch your condition.  Will get help right away if you are not doing well or get worse. Document Released: 06/13/2006 Document Revised: 04/25/2011 Document Reviewed: 08/14/2006 ExitCare Patient Information 2014 [employer], [place].   ________________________________________________________________________  WHAT IS A BLOOD TRANSFUSION? Blood Transfusion Information  A transfusion is the replacement of blood or some of its parts. Blood is made up of multiple cells which provide different functions.  Red blood cells carry oxygen and are used for blood loss replacement.  White blood cells fight against infection.  Platelets control bleeding.  Plasma helps clot blood.  Other blood products are available for specialized needs, such as hemophilia or other clotting disorders. BEFORE THE TRANSFUSION  Who gives blood for transfusions?   Healthy volunteers who are fully evaluated to make sure their blood is safe. This is blood bank blood. Transfusion therapy is the safest it has ever been in the practice of medicine. Before blood is taken from a donor, a complete history is taken to make sure that person has no history of diseases nor engages in risky social behavior (examples are intravenous drug use or sexual activity with multiple partners). The donor's travel history is screened to minimize risk of transmitting infections, such as malaria. The donated blood is tested for signs of infectious diseases, such as HIV and hepatitis. The blood is then tested to be sure it is compatible with you in order to minimize the chance of a transfusion reaction. If you or a relative  donates blood, this is often done in anticipation of surgery and is not appropriate for emergency situations. It takes many days to process the donated blood. RISKS AND COMPLICATIONS Although transfusion therapy is very safe and saves many lives, the main dangers of transfusion include:   Getting an infectious disease.  Developing a transfusion reaction. This is an allergic reaction to something in the blood you were given. Every precaution is taken to prevent this. The decision to have a blood transfusion has been considered carefully by your caregiver before blood is given. Blood is not given unless the benefits outweigh the risks. AFTER THE TRANSFUSION  Right after receiving a blood transfusion, you will usually feel much better and more energetic. This is especially true if your red blood cells have gotten low (anemic). The transfusion raises the level of the red blood cells which carry oxygen, and this usually causes an energy increase.  The nurse administering the transfusion will monitor you carefully for complications. HOME CARE INSTRUCTIONS  No special instructions are needed after a transfusion. You may find your energy is better. Speak with your caregiver about any limitations on activity for underlying diseases you may have. SEEK MEDICAL CARE IF:   Your condition is not improving after your transfusion.  You develop redness or irritation at the intravenous (IV) site. SEEK IMMEDIATE MEDICAL CARE IF:  Any of the following symptoms occur over the next 12 hours:  Shaking chills.  You have a temperature by mouth above 102 F (38.9 C), not controlled by  medicine.  Chest, back, or muscle pain.  People around you feel you are not acting correctly or are confused.  Shortness of breath or difficulty breathing.  Dizziness and fainting.  You get a rash or develop hives.  You have a decrease in urine output.  Your urine turns a dark color or changes to pink, red, or brown. Any  of the following symptoms occur over the next 10 days:  You have a temperature by mouth above 102 F (38.9 C), not controlled by medicine.  Shortness of breath.  Weakness after normal activity.  The white part of the eye turns yellow (jaundice).  You have a decrease in the amount of urine or are urinating less often.  Your urine turns a dark color or changes to pink, red, or brown. Document Released: 01/29/2000 Document Revised: 04/25/2011 Document Reviewed: 09/17/2007 [facility] Patient Information 2014 [place], [place].  _______________________________________________________________________

## 2019-11-19 NOTE — Progress Notes (Addendum)
COVID Vaccine Completed: x2 Date COVID Vaccine completed: 03-06-19 & 03-25-19 COVID vaccine manufacturer: [place]   PCP - Adrian Prows, MD Cardiologist -  Christoper Fabian, MD  Clearance on chart dated 10-10-19  Chest x-ray -  EKG - 09-30-19 at [facility]. On Chart Stress Test - 07-06-17 Care Everywhere ECHO -  Cardiac Cath -  Pacemaker/ICD device last checked:  Sleep Study -  CPAP - Yes.  Settings 13  Fasting Blood Sugar -  Checks Blood Sugar _____ times a day  Blood Thinner Instructions:  Xarelto 20 mg.  Hold 3 days prior to surgery  Aspirin Instructions: Last Dose:  Anesthesia review:  Afib, OSA  Patient denies shortness of breath, fever, cough and chest pain at PAT appointment   Patient verbalized understanding of instructions that were given to them at the PAT appointment. Patient was also instructed that they will need to review over the PAT instructions again at home before surgery.

## 2019-11-21 ENCOUNTER — Other Ambulatory Visit: Payer: Self-pay

## 2019-11-21 ENCOUNTER — Encounter (HOSPITAL_COMMUNITY): Payer: Self-pay

## 2019-11-21 ENCOUNTER — Encounter (HOSPITAL_COMMUNITY)
Admission: RE | Admit: 2019-11-21 | Discharge: 2019-11-21 | Disposition: A | Discharge status: Home / Self Care | Payer: Medicare Other | Source: Ambulatory Visit | Attending: Orthopedic Surgery | Admitting: Orthopedic Surgery

## 2019-11-21 DIAGNOSIS — Z01812 Encounter for preprocedural laboratory examination: Secondary | ICD-10-CM | POA: Diagnosis present

## 2019-11-21 LAB — CBC
HCT: 43 % (ref 36.0–46.0)
Hemoglobin: 13 g/dL (ref 12.0–15.0)
MCH: 27.7 pg (ref 26.0–34.0)
MCHC: 30.2 g/dL (ref 30.0–36.0)
MCV: 91.7 fL (ref 80.0–100.0)
Platelets: 188 10*3/uL (ref 150–400)
RBC: 4.69 MIL/uL (ref 3.87–5.11)
RDW: 15 % (ref 11.5–15.5)
WBC: 7.5 10*3/uL (ref 4.0–10.5)
nRBC: 0 % (ref 0.0–0.2)

## 2019-11-21 LAB — PROTIME-INR
INR: 1.5 — ABNORMAL HIGH (ref 0.8–1.2)
Prothrombin Time: 18 seconds — ABNORMAL HIGH (ref 11.4–15.2)

## 2019-11-21 LAB — COMPREHENSIVE METABOLIC PANEL
ALT: 15 U/L (ref 0–44)
AST: 18 U/L (ref 15–41)
Albumin: 4 g/dL (ref 3.5–5.0)
Alkaline Phosphatase: 85 U/L (ref 38–126)
Anion gap: 14 (ref 5–15)
BUN: 15 mg/dL (ref 8–23)
CO2: 26 mmol/L (ref 22–32)
Calcium: 9.9 mg/dL (ref 8.9–10.3)
Chloride: 102 mmol/L (ref 98–111)
Creatinine, Ser: 0.55 mg/dL (ref 0.44–1.00)
GFR calc non Af Amer: >60 mL/min (ref >60)
Glucose, Bld: 83 mg/dL (ref 70–99)
Potassium: 3.7 mmol/L (ref 3.5–5.1)
Sodium: 142 mmol/L (ref 135–145)
Total Bilirubin: 1.2 mg/dL (ref 0.3–1.2)
Total Protein: 7.4 g/dL (ref 6.5–8.1)

## 2019-11-21 LAB — SURGICAL PCR SCREEN
MRSA, PCR: NEGATIVE
Staphylococcus aureus: NEGATIVE

## 2019-11-21 LAB — APTT: aPTT: 42 seconds — ABNORMAL HIGH (ref 24–36)

## 2019-11-21 NOTE — Progress Notes (Signed)
PT and PTT sent to Dr. Wynelle Link to review.

## 2019-11-22 ENCOUNTER — Other Ambulatory Visit (HOSPITAL_COMMUNITY)
Admission: RE | Admit: 2019-11-22 | Discharge: 2019-11-22 | Disposition: A | Discharge status: Home / Self Care | Payer: Medicare Other | Source: Ambulatory Visit | Attending: Orthopedic Surgery | Admitting: Orthopedic Surgery

## 2019-11-22 DIAGNOSIS — Z01818 Encounter for other preprocedural examination: Secondary | ICD-10-CM | POA: Insufficient documentation

## 2019-11-22 DIAGNOSIS — Z20822 Contact with and (suspected) exposure to covid-19: Secondary | ICD-10-CM | POA: Insufficient documentation

## 2019-11-22 LAB — SARS CORONAVIRUS 2 (TAT 6-24 HRS): SARS Coronavirus 2: NEGATIVE

## 2019-11-23 MED ORDER — BUPIVACAINE LIPOSOME 1.3 % IJ SUSP
20.0000 mL | INTRAMUSCULAR | Status: DC
  Filled 2019-11-23: qty 20

## 2019-11-25 ENCOUNTER — Observation Stay (HOSPITAL_COMMUNITY)
Admission: RE | Admit: 2019-11-25 | Discharge: 2019-11-26 | Disposition: A | Discharge status: Home / Self Care | Payer: Medicare Other | Attending: Orthopedic Surgery | Admitting: Orthopedic Surgery

## 2019-11-25 ENCOUNTER — Encounter (HOSPITAL_COMMUNITY): Payer: Self-pay | Admitting: Orthopedic Surgery

## 2019-11-25 ENCOUNTER — Other Ambulatory Visit: Payer: Self-pay

## 2019-11-25 ENCOUNTER — Encounter (HOSPITAL_COMMUNITY)
Admission: RE | Disposition: A | Discharge status: Home / Self Care | Payer: Self-pay | Source: Home / Self Care | Attending: Orthopedic Surgery

## 2019-11-25 ENCOUNTER — Inpatient Hospital Stay (HOSPITAL_COMMUNITY): Payer: Medicare Other | Admitting: Certified Registered"

## 2019-11-25 ENCOUNTER — Inpatient Hospital Stay (HOSPITAL_COMMUNITY): Payer: Medicare Other | Admitting: Physician Assistant

## 2019-11-25 DIAGNOSIS — M1712 Unilateral primary osteoarthritis, left knee: Principal | ICD-10-CM | POA: Diagnosis present

## 2019-11-25 DIAGNOSIS — M1711 Unilateral primary osteoarthritis, right knee: Secondary | ICD-10-CM | POA: Insufficient documentation

## 2019-11-25 DIAGNOSIS — I10 Essential (primary) hypertension: Secondary | ICD-10-CM | POA: Diagnosis not present

## 2019-11-25 DIAGNOSIS — Z96651 Presence of right artificial knee joint: Secondary | ICD-10-CM | POA: Insufficient documentation

## 2019-11-25 DIAGNOSIS — Z79899 Other long term (current) drug therapy: Secondary | ICD-10-CM | POA: Insufficient documentation

## 2019-11-25 DIAGNOSIS — Z87891 Personal history of nicotine dependence: Secondary | ICD-10-CM | POA: Insufficient documentation

## 2019-11-25 DIAGNOSIS — I4891 Unspecified atrial fibrillation: Secondary | ICD-10-CM | POA: Diagnosis not present

## 2019-11-25 DIAGNOSIS — E1165 Type 2 diabetes mellitus with hyperglycemia: Secondary | ICD-10-CM | POA: Insufficient documentation

## 2019-11-25 DIAGNOSIS — Z8542 Personal history of malignant neoplasm of other parts of uterus: Secondary | ICD-10-CM | POA: Insufficient documentation

## 2019-11-25 DIAGNOSIS — Z7984 Long term (current) use of oral hypoglycemic drugs: Secondary | ICD-10-CM | POA: Insufficient documentation

## 2019-11-25 DIAGNOSIS — Z7901 Long term (current) use of anticoagulants: Secondary | ICD-10-CM | POA: Diagnosis not present

## 2019-11-25 DIAGNOSIS — Z853 Personal history of malignant neoplasm of breast: Secondary | ICD-10-CM | POA: Diagnosis not present

## 2019-11-25 HISTORY — PX: TOTAL KNEE ARTHROPLASTY: SHX125

## 2019-11-25 LAB — TYPE AND SCREEN
ABO/RH(D): A POS
Antibody Screen: NEGATIVE

## 2019-11-25 LAB — GLUCOSE, CAPILLARY: Glucose-Capillary: 99 mg/dL (ref 70–99)

## 2019-11-25 SURGERY — ARTHROPLASTY, KNEE, TOTAL
Anesthesia: Spinal | Site: Knee | Laterality: Left

## 2019-11-25 MED ORDER — CEFAZOLIN SODIUM-DEXTROSE 2-4 GM/100ML-% IV SOLN
2.0000 g | Freq: Four times a day (QID) | INTRAVENOUS | Status: AC
  Administered 2019-11-25 (×2): 2 g via INTRAVENOUS
  Filled 2019-11-25 (×2): qty 100

## 2019-11-25 MED ORDER — ONDANSETRON HCL 4 MG PO TABS
4.0000 mg | ORAL_TABLET | Freq: Four times a day (QID) | ORAL | Status: DC | PRN
  Administered 2019-11-25: 4 mg via ORAL

## 2019-11-25 MED ORDER — SODIUM CHLORIDE 0.9 % IR SOLN
Status: DC | PRN
  Administered 2019-11-25: 1000 mL

## 2019-11-25 MED ORDER — LACTATED RINGERS IV SOLN: INTRAVENOUS | Status: DC

## 2019-11-25 MED ORDER — MIDAZOLAM HCL 2 MG/2ML IJ SOLN
INTRAMUSCULAR | Status: AC
  Administered 2019-11-25: 1 mg
  Filled 2019-11-25: qty 2

## 2019-11-25 MED ORDER — ATORVASTATIN CALCIUM 20 MG PO TABS: 20.0000 mg | ORAL_TABLET | Freq: Every evening | ORAL | Status: DC

## 2019-11-25 MED ORDER — RIVAROXABAN 10 MG PO TABS
10.0000 mg | ORAL_TABLET | Freq: Every day | ORAL | Status: DC
  Administered 2019-11-26: 10 mg via ORAL
  Filled 2019-11-25: qty 1

## 2019-11-25 MED ORDER — FENTANYL CITRATE (PF) 100 MCG/2ML IJ SOLN
50.0000 ug | INTRAMUSCULAR | Status: DC
  Administered 2019-11-25: 50 ug via INTRAVENOUS
  Filled 2019-11-25: qty 2

## 2019-11-25 MED ORDER — SODIUM CHLORIDE 0.9 % IV SOLN: INTRAVENOUS | Status: DC

## 2019-11-25 MED ORDER — ONDANSETRON HCL 4 MG/2ML IJ SOLN
INTRAMUSCULAR | Status: DC | PRN
  Administered 2019-11-25: 4 mg via INTRAVENOUS

## 2019-11-25 MED ORDER — BUPIVACAINE LIPOSOME 1.3 % IJ SUSP
INTRAMUSCULAR | Status: DC | PRN
  Administered 2019-11-25: 20 mL

## 2019-11-25 MED ORDER — SODIUM CHLORIDE (PF) 0.9 % IJ SOLN
INTRAMUSCULAR | Status: AC
  Filled 2019-11-25: qty 50

## 2019-11-25 MED ORDER — POVIDONE-IODINE 10 % EX SWAB
2.0000 "application " | Freq: Once | CUTANEOUS | Status: AC
  Administered 2019-11-25: 2 via TOPICAL

## 2019-11-25 MED ORDER — ORAL CARE MOUTH RINSE: 15.0000 mL | Freq: Once | OROMUCOSAL | Status: AC

## 2019-11-25 MED ORDER — DOCUSATE SODIUM 100 MG PO CAPS
100.0000 mg | ORAL_CAPSULE | Freq: Two times a day (BID) | ORAL | Status: DC
  Administered 2019-11-25 – 2019-11-26 (×2): 100 mg via ORAL
  Filled 2019-11-25 (×2): qty 1

## 2019-11-25 MED ORDER — PHENOL 1.4 % MT LIQD: 1.0000 | OROMUCOSAL | Status: DC | PRN

## 2019-11-25 MED ORDER — CEFAZOLIN SODIUM-DEXTROSE 2-4 GM/100ML-% IV SOLN
2.0000 g | INTRAVENOUS | Status: AC
  Administered 2019-11-25: 2 g via INTRAVENOUS
  Filled 2019-11-25: qty 100

## 2019-11-25 MED ORDER — HYDROMORPHONE HCL 1 MG/ML IJ SOLN
INTRAMUSCULAR | Status: AC
  Filled 2019-11-25: qty 1

## 2019-11-25 MED ORDER — METHOCARBAMOL 500 MG IVPB - SIMPLE MED
INTRAVENOUS | Status: AC
  Filled 2019-11-25: qty 50

## 2019-11-25 MED ORDER — ONDANSETRON HCL 4 MG/2ML IJ SOLN
INTRAMUSCULAR | Status: AC
  Filled 2019-11-25: qty 2

## 2019-11-25 MED ORDER — DEXAMETHASONE SODIUM PHOSPHATE 10 MG/ML IJ SOLN
8.0000 mg | Freq: Once | INTRAMUSCULAR | Status: AC
  Administered 2019-11-25: 8 mg via INTRAVENOUS

## 2019-11-25 MED ORDER — FLEET ENEMA 7-19 GM/118ML RE ENEM: 1.0000 | ENEMA | Freq: Once | RECTAL | Status: DC | PRN

## 2019-11-25 MED ORDER — ACETAMINOPHEN 10 MG/ML IV SOLN
1000.0000 mg | Freq: Four times a day (QID) | INTRAVENOUS | Status: DC
  Administered 2019-11-25: 1000 mg via INTRAVENOUS
  Filled 2019-11-25: qty 100

## 2019-11-25 MED ORDER — METOCLOPRAMIDE HCL 5 MG PO TABS: 5.0000 mg | ORAL_TABLET | Freq: Three times a day (TID) | ORAL | Status: DC | PRN

## 2019-11-25 MED ORDER — PROPOFOL 10 MG/ML IV BOLUS
INTRAVENOUS | Status: DC | PRN
  Administered 2019-11-25: 120 mg via INTRAVENOUS
  Administered 2019-11-25: 20 mg via INTRAVENOUS
  Administered 2019-11-25: 30 mg via INTRAVENOUS

## 2019-11-25 MED ORDER — TRAMADOL HCL 50 MG PO TABS
50.0000 mg | ORAL_TABLET | Freq: Four times a day (QID) | ORAL | Status: DC | PRN
  Administered 2019-11-25 (×2): 50 mg via ORAL
  Filled 2019-11-25: qty 2

## 2019-11-25 MED ORDER — BISACODYL 10 MG RE SUPP: 10.0000 mg | Freq: Every day | RECTAL | Status: DC | PRN

## 2019-11-25 MED ORDER — POLYETHYLENE GLYCOL 3350 17 G PO PACK: 17.0000 g | PACK | Freq: Every day | ORAL | Status: DC | PRN

## 2019-11-25 MED ORDER — OXYBUTYNIN CHLORIDE ER 5 MG PO TB24
15.0000 mg | ORAL_TABLET | Freq: Every day | ORAL | Status: DC
  Administered 2019-11-26: 15 mg via ORAL
  Filled 2019-11-25: qty 3

## 2019-11-25 MED ORDER — MENTHOL 3 MG MT LOZG: 1.0000 | LOZENGE | OROMUCOSAL | Status: DC | PRN

## 2019-11-25 MED ORDER — GABAPENTIN 100 MG PO CAPS
100.0000 mg | ORAL_CAPSULE | Freq: Three times a day (TID) | ORAL | Status: DC
  Administered 2019-11-25 – 2019-11-26 (×3): 100 mg via ORAL
  Filled 2019-11-25 (×4): qty 1

## 2019-11-25 MED ORDER — DEXAMETHASONE SODIUM PHOSPHATE 10 MG/ML IJ SOLN
10.0000 mg | Freq: Once | INTRAMUSCULAR | Status: AC
  Administered 2019-11-26: 10 mg via INTRAVENOUS
  Filled 2019-11-25: qty 1

## 2019-11-25 MED ORDER — METHOCARBAMOL 500 MG IVPB - SIMPLE MED
500.0000 mg | Freq: Four times a day (QID) | INTRAVENOUS | Status: DC | PRN
  Administered 2019-11-25: 500 mg via INTRAVENOUS
  Filled 2019-11-25: qty 50

## 2019-11-25 MED ORDER — METOCLOPRAMIDE HCL 5 MG/ML IJ SOLN
5.0000 mg | Freq: Three times a day (TID) | INTRAMUSCULAR | Status: DC | PRN
  Administered 2019-11-25: 10 mg via INTRAVENOUS
  Filled 2019-11-25: qty 2

## 2019-11-25 MED ORDER — METHOCARBAMOL 500 MG PO TABS
500.0000 mg | ORAL_TABLET | Freq: Four times a day (QID) | ORAL | Status: DC | PRN
  Administered 2019-11-25 – 2019-11-26 (×2): 500 mg via ORAL
  Filled 2019-11-25 (×2): qty 1

## 2019-11-25 MED ORDER — OXYCODONE HCL 5 MG PO TABS
5.0000 mg | ORAL_TABLET | ORAL | Status: DC | PRN
  Administered 2019-11-25 – 2019-11-26 (×5): 10 mg via ORAL
  Filled 2019-11-25 (×5): qty 2

## 2019-11-25 MED ORDER — ROPIVACAINE HCL 7.5 MG/ML IJ SOLN
INTRAMUSCULAR | Status: DC | PRN
  Administered 2019-11-25: 20 mL via PERINEURAL

## 2019-11-25 MED ORDER — SODIUM CHLORIDE (PF) 0.9 % IJ SOLN
INTRAMUSCULAR | Status: DC | PRN
  Administered 2019-11-25: 60 mL

## 2019-11-25 MED ORDER — 0.9 % SODIUM CHLORIDE (POUR BTL) OPTIME
TOPICAL | Status: DC | PRN
  Administered 2019-11-25: 1000 mL

## 2019-11-25 MED ORDER — ACETAMINOPHEN 325 MG PO TABS: 325.0000 mg | ORAL_TABLET | Freq: Four times a day (QID) | ORAL | Status: DC | PRN

## 2019-11-25 MED ORDER — ONDANSETRON HCL 4 MG/2ML IJ SOLN: 4.0000 mg | Freq: Once | INTRAMUSCULAR | Status: DC | PRN

## 2019-11-25 MED ORDER — MEPERIDINE HCL 50 MG/ML IJ SOLN: 6.2500 mg | INTRAMUSCULAR | Status: DC | PRN

## 2019-11-25 MED ORDER — PROPOFOL 500 MG/50ML IV EMUL
INTRAVENOUS | Status: AC
  Filled 2019-11-25: qty 50

## 2019-11-25 MED ORDER — METOPROLOL SUCCINATE ER 50 MG PO TB24
50.0000 mg | ORAL_TABLET | Freq: Every day | ORAL | Status: DC
  Administered 2019-11-26: 50 mg via ORAL
  Filled 2019-11-25: qty 1

## 2019-11-25 MED ORDER — CHLORHEXIDINE GLUCONATE 0.12 % MT SOLN
15.0000 mL | Freq: Once | OROMUCOSAL | Status: AC
  Administered 2019-11-25: 15 mL via OROMUCOSAL

## 2019-11-25 MED ORDER — TRANEXAMIC ACID-NACL 1000-0.7 MG/100ML-% IV SOLN
1000.0000 mg | INTRAVENOUS | Status: AC
  Administered 2019-11-25: 1000 mg via INTRAVENOUS
  Filled 2019-11-25: qty 100

## 2019-11-25 MED ORDER — HYDROCHLOROTHIAZIDE 25 MG PO TABS
25.0000 mg | ORAL_TABLET | Freq: Every day | ORAL | Status: DC
  Administered 2019-11-26: 25 mg via ORAL
  Filled 2019-11-25: qty 1

## 2019-11-25 MED ORDER — DEXAMETHASONE SODIUM PHOSPHATE 10 MG/ML IJ SOLN
INTRAMUSCULAR | Status: AC
  Filled 2019-11-25: qty 1

## 2019-11-25 MED ORDER — LATANOPROST 0.005 % OP SOLN
1.0000 [drp] | Freq: Every day | OPHTHALMIC | Status: DC
  Administered 2019-11-25: 1 [drp] via OPHTHALMIC
  Filled 2019-11-25: qty 2.5

## 2019-11-25 MED ORDER — MORPHINE SULFATE (PF) 2 MG/ML IV SOLN
0.5000 mg | INTRAVENOUS | Status: DC | PRN
  Administered 2019-11-25: 1 mg via INTRAVENOUS
  Filled 2019-11-25: qty 1

## 2019-11-25 MED ORDER — PROPOFOL 500 MG/50ML IV EMUL
INTRAVENOUS | Status: DC | PRN
  Administered 2019-11-25: 75 ug/kg/min via INTRAVENOUS

## 2019-11-25 MED ORDER — HYDROMORPHONE HCL 1 MG/ML IJ SOLN
0.5000 mg | Freq: Once | INTRAMUSCULAR | Status: AC
  Administered 2019-11-25: 0.5 mg via INTRAVENOUS

## 2019-11-25 MED ORDER — ONDANSETRON HCL 4 MG/2ML IJ SOLN
4.0000 mg | Freq: Four times a day (QID) | INTRAMUSCULAR | Status: DC | PRN
  Filled 2019-11-25: qty 2

## 2019-11-25 MED ORDER — SODIUM CHLORIDE (PF) 0.9 % IJ SOLN
INTRAMUSCULAR | Status: AC
  Filled 2019-11-25: qty 10

## 2019-11-25 MED ORDER — PROMETHAZINE HCL 25 MG/ML IJ SOLN
6.2500 mg | Freq: Four times a day (QID) | INTRAMUSCULAR | Status: DC | PRN
  Administered 2019-11-25: 6.25 mg via INTRAVENOUS
  Filled 2019-11-25: qty 1

## 2019-11-25 MED ORDER — HYDROMORPHONE HCL 1 MG/ML IJ SOLN
0.2500 mg | INTRAMUSCULAR | Status: DC | PRN
  Administered 2019-11-25 (×4): 0.5 mg via INTRAVENOUS

## 2019-11-25 MED ORDER — DIPHENHYDRAMINE HCL 12.5 MG/5ML PO ELIX
12.5000 mg | ORAL_SOLUTION | ORAL | Status: DC | PRN
  Administered 2019-11-25: 25 mg via ORAL
  Filled 2019-11-25: qty 10

## 2019-11-25 SURGICAL SUPPLY — 57 items
BAG SPEC THK2 15X12 ZIP CLS (MISCELLANEOUS) ×1
BAG ZIPLOCK 12X15 (MISCELLANEOUS) ×3 IMPLANT
BLADE SAG 18X100X1.27 (BLADE) ×3 IMPLANT
BLADE SAW SGTL 11.0X1.19X90.0M (BLADE) ×3 IMPLANT
BLADE SURG SZ10 CARB STEEL (BLADE) ×6 IMPLANT
BNDG CMPR MED 10X6 ELC LF (GAUZE/BANDAGES/DRESSINGS) ×1
BNDG ELASTIC 6X10 VLCR STRL LF (GAUZE/BANDAGES/DRESSINGS) ×2 IMPLANT
BNDG ELASTIC 6X5.8 VLCR STR LF (GAUZE/BANDAGES/DRESSINGS) ×3 IMPLANT
BOWL SMART MIX CTS (DISPOSABLE) ×3 IMPLANT
CEMENT HV SMART SET (Cement) ×6 IMPLANT
CEMENT TIBIA MBT (Knees) IMPLANT
CLOSURE STERI-STRIP 1/2X4 (GAUZE/BANDAGES/DRESSINGS) ×1
CLOSURE WOUND 1/2 X4 (GAUZE/BANDAGES/DRESSINGS) ×2
CLSR STERI-STRIP ANTIMIC 1/2X4 (GAUZE/BANDAGES/DRESSINGS) ×1 IMPLANT
COVER SURGICAL LIGHT HANDLE (MISCELLANEOUS) ×3 IMPLANT
COVER WAND RF STERILE (DRAPES) IMPLANT
CUFF TOURN SGL QUICK 34 (TOURNIQUET CUFF) ×3
CUFF TRNQT CYL 34X4.125X (TOURNIQUET CUFF) ×1 IMPLANT
DECANTER SPIKE VIAL GLASS SM (MISCELLANEOUS) ×3 IMPLANT
DRAPE U-SHAPE 47X51 STRL (DRAPES) ×3 IMPLANT
DRSG AQUACEL AG ADV 3.5X10 (GAUZE/BANDAGES/DRESSINGS) ×3 IMPLANT
DURAPREP 26ML APPLICATOR (WOUND CARE) ×3 IMPLANT
ELECT REM PT RETURN 15FT ADLT (MISCELLANEOUS) ×3 IMPLANT
GLOVE BIO SURGEON STRL SZ7 (GLOVE) ×3 IMPLANT
GLOVE BIO SURGEON STRL SZ8 (GLOVE) ×3 IMPLANT
GLOVE BIOGEL PI IND STRL 7.0 (GLOVE) ×1 IMPLANT
GLOVE BIOGEL PI IND STRL 8 (GLOVE) ×1 IMPLANT
GLOVE BIOGEL PI INDICATOR 7.0 (GLOVE) ×2
GLOVE BIOGEL PI INDICATOR 8 (GLOVE) ×2
GOWN STRL REUS W/TWL LRG LVL3 (GOWN DISPOSABLE) ×6 IMPLANT
HANDPIECE INTERPULSE COAX TIP (DISPOSABLE) ×3
HOLDER FOLEY CATH W/STRAP (MISCELLANEOUS) IMPLANT
IMMOBILIZER KNEE 20 (SOFTGOODS) ×3
IMMOBILIZER KNEE 20 THIGH 36 (SOFTGOODS) ×1 IMPLANT
IMPL FEMUR SIGMA LT PS SZ 3 (Knees) IMPLANT
IMPLANT FEMUR SIGMA LT PS SZ 3 (Knees) ×3 IMPLANT
KIT TURNOVER KIT A (KITS) IMPLANT
MANIFOLD NEPTUNE II (INSTRUMENTS) ×3 IMPLANT
NS IRRIG 1000ML POUR BTL (IV SOLUTION) ×3 IMPLANT
PACK TOTAL KNEE CUSTOM (KITS) ×3 IMPLANT
PADDING CAST COTTON 6X4 STRL (CAST SUPPLIES) ×4 IMPLANT
PATELLA DOME PFC 35MM (Knees) ×2 IMPLANT
PENCIL SMOKE EVACUATOR (MISCELLANEOUS) ×3 IMPLANT
PIN STEINMAN FIXATION KNEE (PIN) ×2 IMPLANT
PLATE ROT INSERT 12.5MM SIZE 3 (Plate) ×2 IMPLANT
PROTECTOR NERVE ULNAR (MISCELLANEOUS) ×3 IMPLANT
SET HNDPC FAN SPRY TIP SCT (DISPOSABLE) ×1 IMPLANT
STRIP CLOSURE SKIN 1/2X4 (GAUZE/BANDAGES/DRESSINGS) ×4 IMPLANT
SUT MNCRL AB 4-0 PS2 18 (SUTURE) ×3 IMPLANT
SUT STRATAFIX 0 PDS 27 VIOLET (SUTURE) ×3
SUT VIC AB 2-0 CT1 27 (SUTURE) ×9
SUT VIC AB 2-0 CT1 TAPERPNT 27 (SUTURE) ×3 IMPLANT
SUTURE STRATFX 0 PDS 27 VIOLET (SUTURE) ×1 IMPLANT
TIBIA MBT CEMENT (Knees) ×3 IMPLANT
TRAY FOLEY MTR SLVR 14FR STAT (SET/KITS/TRAYS/PACK) ×2 IMPLANT
WATER STERILE IRR 1000ML POUR (IV SOLUTION) ×6 IMPLANT
WRAP KNEE MAXI GEL POST OP (GAUZE/BANDAGES/DRESSINGS) ×3 IMPLANT

## 2019-11-25 NOTE — Op Note (Signed)
OPERATIVE REPORT-TOTAL KNEE ARTHROPLASTY   Pre-operative diagnosis- Osteoarthritis  Left knee(s)  Post-operative diagnosis- Osteoarthritis Left knee(s)  Procedure-  Left  Total Knee Arthroplasty  Surgeon- Dione Plover. Jakhi Dishman, MD  Assistant- Molli Barrows, PA-C   Anesthesia-  GA combined with regional for post-op pain  EBL-50 mL   Drains None  Tourniquet time-  Total Tourniquet Time Documented: Thigh (Left) - 35 minutes Total: Thigh (Left) - 35 minutes     Complications- None  Condition-PACU - hemodynamically stable.   Brief Clinical Note  Brooke Krueger is a 79 y.o. year old female with end stage OA of her left knee with progressively worsening pain and dysfunction. She has constant pain, with activity and at rest and significant functional deficits with difficulties even with ADLs. She has had extensive non-op management including analgesics, injections of cortisone and viscosupplements, and home exercise program, but remains in significant pain with significant dysfunction. Radiographs show bone on bone arthritis medial and patellofemoral. She presents now for left Total Knee Arthroplasty.    Procedure in detail---   The patient is brought into the operating room and positioned supine on the operating table. After successful administration of  GA combined with regional for post-op pain,   a tourniquet is placed high on the  Left thigh(s) and the lower extremity is prepped and draped in the usual sterile fashion. Time out is performed by the operating team and then the  Left lower extremity is wrapped in Esmarch, knee flexed and the tourniquet inflated to 300 mmHg.       A midline incision is made with a ten blade through the subcutaneous tissue to the level of the extensor mechanism. A fresh blade is used to make a medial parapatellar arthrotomy. Soft tissue over the proximal medial tibia is subperiosteally elevated to the joint line with a knife and into the semimembranosus  bursa with a Cobb elevator. Soft tissue over the proximal lateral tibia is elevated with attention being paid to avoiding the patellar tendon on the tibial tubercle. The patella is everted, knee flexed 90 degrees and the ACL and PCL are removed. Findings are bone on bone medial and patellofemoral with large global osteophytes.        The drill is used to create a starting hole in the distal femur and the canal is thoroughly irrigated with sterile saline to remove the fatty contents. The 5 degree Left  valgus alignment guide is placed into the femoral canal and the distal femoral cutting block is pinned to remove 10 mm off the distal femur. Resection is made with an oscillating saw.      The tibia is subluxed forward and the menisci are removed. The extramedullary alignment guide is placed referencing proximally at the medial aspect of the tibial tubercle and distally along the second metatarsal axis and tibial crest. The block is pinned to remove 32mm off the more deficient medial  side. Resection is made with an oscillating saw. Size 3is the most appropriate size for the tibia and the proximal tibia is prepared with the modular drill and keel punch for that size.      The femoral sizing guide is placed and size 3 is most appropriate. Rotation is marked off the epicondylar axis and confirmed by creating a rectangular flexion gap at 90 degrees. The size 3 cutting block is pinned in this rotation and the anterior, posterior and chamfer cuts are made with the oscillating saw. The intercondylar block is then placed and that  cut is made.      Trial size 3 tibial component, trial size 3 posterior stabilized femur and a 12.5  mm posterior stabilized rotating platform insert trial is placed. Full extension is achieved with excellent varus/valgus and anterior/posterior balance throughout full range of motion. The patella is everted and thickness measured to be 22  mm. Free hand resection is taken to 12 mm, a 35 template  is placed, lug holes are drilled, trial patella is placed, and it tracks normally. Osteophytes are removed off the posterior femur with the trial in place. All trials are removed and the cut bone surfaces prepared with pulsatile lavage. Cement is mixed and once ready for implantation, the size 3 tibial implant, size  3 posterior stabilized femoral component, and the size 35 patella are cemented in place and the patella is held with the clamp. The trial insert is placed and the knee held in full extension. The Exparel (20 ml mixed with 60 ml saline) is injected into the extensor mechanism, posterior capsule, medial and lateral gutters and subcutaneous tissues.  All extruded cement is removed and once the cement is hard the permanent 12.5 mm posterior stabilized rotating platform insert is placed into the tibial tray.      The wound is copiously irrigated with saline solution and the extensor mechanism closed with # 0 Stratofix suture. The tourniquet is released for a total tourniquet time of 35  minutes. Flexion against gravity is 140 degrees and the patella tracks normally. Subcutaneous tissue is closed with 2.0 vicryl and subcuticular with running 4.0 Monocryl. The incision is cleaned and dried and steri-strips and a bulky sterile dressing are applied. The limb is placed into a knee immobilizer and the patient is awakened and transported to recovery in stable condition.      Please note that a surgical assistant was a medical necessity for this procedure in order to perform it in a safe and expeditious manner. Surgical assistant was necessary to retract the ligaments and vital neurovascular structures to prevent injury to them and also necessary for proper positioning of the limb to allow for anatomic placement of the prosthesis.   Dione Plover Rayne Cowdrey, MD    11/25/2019, 10:30 AM

## 2019-11-25 NOTE — Progress Notes (Signed)
Orthopedic Tech Progress Note Patient Details:  Electra Paladino [facility] 11/25/1940 [id number] Patient already has knee immobilizer. Patient ID: Arlyce Circle, female   DOB: Feb 06, 1941, 80 y.o.   MRN: [id number]   Braulio Bosch 11/25/2019, 12:54 PM

## 2019-11-25 NOTE — Anesthesia Postprocedure Evaluation (Signed)
Anesthesia Post Note  Patient: Brooke Krueger  Procedure(s) Performed: TOTAL KNEE ARTHROPLASTY (Left Knee)     Patient location during evaluation: PACU Anesthesia Type: Spinal Level of consciousness: oriented and awake and alert Pain management: pain level controlled Vital Signs Assessment: post-procedure vital signs reviewed and stable Respiratory status: spontaneous breathing, respiratory function stable and patient connected to nasal cannula oxygen Cardiovascular status: blood pressure returned to baseline and stable Postop Assessment: no headache, no backache and no apparent nausea or vomiting Anesthetic complications: no   No complications documented.  Last Vitals:  Vitals:   11/25/19 1424 11/25/19 1532  BP: (!) 143/86 (!) 144/107  Pulse: 90 92  Resp: 14 18  Temp:    SpO2:  95%    Last Pain:  Vitals:   11/25/19 1307  TempSrc:   PainSc: 8                  Armon Orvis DAVID

## 2019-11-25 NOTE — Interval H&P Note (Signed)
History and Physical Interval Note:  11/25/2019 7:04 AM  Brooke Krueger  has presented today for surgery, with the diagnosis of left knee osteoarthritis.  The various methods of treatment have been discussed with the patient and family. After consideration of risks, benefits and other options for treatment, the patient has consented to  Procedure(s) with comments: TOTAL KNEE ARTHROPLASTY (Left) - 108min as a surgical intervention.  The patient's history has been reviewed, patient examined, no change in status, stable for surgery.  I have reviewed the patient's chart and labs.  Questions were answered to the patient's satisfaction.     Pilar Plate Inessa Wardrop

## 2019-11-25 NOTE — Anesthesia Procedure Notes (Signed)
Procedure Name: LMA Insertion Date/Time: 11/25/2019 9:51 AM Performed by: Xavian Hardcastle D, CRNA Pre-anesthesia Checklist: Patient identified, Emergency Drugs available, Suction available and Patient being monitored Patient Re-evaluated:Patient Re-evaluated prior to induction Oxygen Delivery Method: Circle system utilized Preoxygenation: Pre-oxygenation with 100% oxygen Induction Type: IV induction Ventilation: Mask ventilation without difficulty LMA: LMA inserted LMA Size: 3.0 Tube type: Oral Number of attempts: 1 Placement Confirmation: positive ETCO2 and breath sounds checked- equal and bilateral Tube secured with: Tape Dental Injury: Teeth and Oropharynx as per pre-operative assessment

## 2019-11-25 NOTE — Progress Notes (Signed)
AssistedDr. Ossey with left, ultrasound guided, adductor canal block. Side rails up, monitors on throughout procedure. See vital signs in flow sheet. Tolerated Procedure well.  

## 2019-11-25 NOTE — Evaluation (Signed)
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  Pt will benefit from skilled PT to increase their independence and safety with mobility to allow discharge to the venue listed below.  Pt reports nausea and has been medicated.  Pt agreeable to OOB to recliner.  Pt with continued nausea with transfer.  RN notified.  Pt plans to return home and has steps to enter home and to use "den."     Follow Up Recommendations Follow surgeon's recommendation for DC plan and follow-up therapies    Equipment Recommendations  None recommended by PT    Recommendations for Other Services       Precautions / Restrictions Precautions Precautions: Fall;Knee Required Braces or Orthoses: Knee Immobilizer - Left Restrictions Weight Bearing Restrictions: No LLE Weight Bearing: Weight bearing as tolerated      Mobility  Bed Mobility Overal bed mobility: Needs Assistance Bed Mobility: Supine to Sit     Supine to sit: Min assist     General bed mobility comments: assist for L LE, pt self assisted upper body with bed rails  Transfers Overall transfer level: Needs assistance Equipment used: Rolling walker (2 wheeled) Transfers: Sit to/from [facility] Sit to Stand: Min assist Stand pivot transfers: Min guard       General transfer comment: verbal cues for UE and LE positioning, assist to rise, pt with nausea and only wished to perform OOB to recliner today  Ambulation/Gait                Stairs            Wheelchair Mobility    Modified Rankin (Stroke Patients Only)       Balance                                             Pertinent Vitals/Pain Pain Assessment: 0-10 Pain  Score: 5  Pain Location: left knee Pain Descriptors / Indicators: Grimacing;Sore Pain Intervention(s): Repositioned;Premedicated before session;Monitored during session    [place] expects to be discharged to:: Private residence Living Arrangements: Alone   Type of Home: House Home Access: Stairs to enter Entrance Stairs-Rails: Psychiatric nurse of Steps: 2 Home Layout: Multi-level Home Equipment: Environmental consultant - 2 wheels;Cane - single point;Toilet riser      Prior Function Level of Independence: Independent               Hand Dominance        Extremity/Trunk Assessment        Lower Extremity Assessment Lower Extremity Assessment: LLE deficits/detail LLE Deficits / Details: unable to perform SLR, maintained KI       Communication   Communication: No difficulties  Cognition Arousal/Alertness: Awake/alert Behavior During Therapy: WFL for tasks assessed/performed Overall Cognitive Status: Within Functional Limits for tasks assessed                                        General Comments      Exercises  Assessment/Plan    PT Assessment Patient needs continued PT services  PT Problem List Decreased mobility;Decreased activity tolerance;Decreased knowledge of use of DME;Decreased strength;Pain;Decreased range of motion       PT Treatment Interventions Gait training;DME instruction;Functional mobility training;Therapeutic activities;Patient/family education;Therapeutic exercise;Stair training    PT Goals (Current goals can be found in the Care Plan section)  Acute Rehab PT Goals PT Goal Formulation: With patient Time For Goal Achievement: 12/02/19 Potential to Achieve Goals: Good    Frequency 7X/week   Barriers to discharge        Co-evaluation               AM-PAC PT "6 Clicks" Mobility  Outcome Measure Help needed turning from your back to your side while in a flat bed without using  bedrails?: A Little Help needed moving from lying on your back to sitting on the side of a flat bed without using bedrails?: A Little Help needed moving to and from a bed to a chair (including a wheelchair)?: A Little Help needed standing up from a chair using your arms (e.g., wheelchair or bedside chair)?: A Little Help needed to walk in hospital room?: A Lot Help needed climbing 3-5 steps with a railing? : A Lot 6 Click Score: 16    End of Session Equipment Utilized During Treatment: Gait belt Activity Tolerance: Patient tolerated treatment well Patient left: with call bell/phone within reach;in chair Nurse Communication: Mobility status (aware pt on chair alarm pad, however unknown box location) PT Visit Diagnosis: Other abnormalities of gait and mobility (R26.89)    Time: 7544-9201 PT Time Calculation (min) (ACUTE ONLY): 18 min   Charges:   PT Evaluation $PT Eval Low Complexity: 1 Low     Kati PT, DPT Acute Rehabilitation Services Pager: [phone] Office: [phone]  York Ram E 11/25/2019, 4:42 PM

## 2019-11-25 NOTE — Anesthesia Procedure Notes (Signed)
Anesthesia Regional Block: Adductor canal block   Pre-Anesthetic Checklist: ,, timeout performed, Correct Patient, Correct Site, Correct Laterality, Correct Procedure, Correct Position, site marked, Risks and benefits discussed,  Surgical consent,  Pre-op evaluation,  At surgeon's request and post-op pain management  Laterality: Left  Prep: chloraprep       Needles:  Injection technique: Single-shot  Needle Type: Echogenic Stimulator Needle     Needle Length: 9cm  Needle Gauge: 21     Additional Needles:   Narrative:  Start time: 11/25/2019 8:29 AM End time: 11/25/2019 8:39 AM Injection made incrementally with aspirations every 5 mL.  Performed by: Personally  Anesthesiologist: Lillia Abed, MD  Additional Notes: Monitors applied. Patient sedated. Sterile prep and drape,hand hygiene and sterile gloves were used. Relevant anatomy identified.Needle position confirmed.Local anesthetic injected incrementally after negative aspiration. Local anesthetic spread visualized around nerve(s). Vascular puncture avoided. No complications. Image printed for medical record.The patient tolerated the procedure well.    Lillia Abed MD

## 2019-11-25 NOTE — Progress Notes (Signed)
Pt placed on CPAP machine per pt request. On home settings 13cm per pt with 2L bled in. Pt states it feels comfortable. RT will continue to monitor.

## 2019-11-25 NOTE — Discharge Instructions (Signed)
 Brooke Aluisio, MD Total Joint Specialist [facility] [address] [place], [place] [address] [phone]  TOTAL KNEE REPLACEMENT POSTOPERATIVE DIRECTIONS    Knee Rehabilitation, Guidelines Following Surgery  Results after knee surgery are often greatly improved when you follow the exercise, range of motion and muscle strengthening exercises prescribed by your doctor. Safety measures are also important to protect the knee from further injury. If any of these exercises cause you to have increased pain or swelling in your knee joint, decrease the amount until you are comfortable again and slowly increase them. If you have problems or questions, call your caregiver or physical therapist for advice.   HOME CARE INSTRUCTIONS  . Remove items at home which could result in a fall. This includes throw rugs or furniture in walking pathways.  . ICE to the affected knee as much as tolerated. Icing helps control swelling. If the swelling is well controlled you will be more comfortable and rehab easier. Continue to use ice on the knee for pain and swelling from surgery. You may notice swelling that will progress down to the foot and ankle. This is normal after surgery. Elevate the leg when you are not up walking on it.    . Continue to use the breathing machine which will help keep your temperature down. It is common for your temperature to cycle up and down following surgery, especially at night when you are not up moving around and exerting yourself. The breathing machine keeps your lungs expanded and your temperature down. . Do not place pillow under the operative knee, focus on keeping the knee straight while resting  DIET You may resume your previous home diet once you are discharged from the hospital.  DRESSING / WOUND CARE / SHOWERING . Keep your bulky bandage on for 2 days. On the third post-operative day you may remove the Ace bandage and gauze. There is a  waterproof adhesive bandage on your skin which will stay in place until your first follow-up appointment. Once you remove this you will not need to place another bandage . You may begin showering 3 days following surgery, but do not submerge the incision under water.  ACTIVITY For the first 5 days, the key is rest and control of pain and swelling . Do your home exercises twice a day starting on post-operative day 3. On the days you go to physical therapy, just do the home exercises once that day. . You should rest, ice and elevate the leg for 50 minutes out of every hour. Get up and walk/stretch for 10 minutes per hour. After 5 days you can increase your activity slowly as tolerated. . Walk with your walker as instructed. Use the walker until you are comfortable transitioning to a cane. Walk with the cane in the opposite hand of the operative leg. You may discontinue the cane once you are comfortable and walking steadily. . Avoid periods of inactivity such as sitting longer than an hour when not asleep. This helps prevent blood clots.  . You may discontinue the knee immobilizer once you are able to perform a straight leg raise while lying down. . You may resume a sexual relationship in one month or when given the OK by your doctor.  . You may return to work once you are cleared by your doctor.  . Do not drive a car for 6 weeks or until released by your surgeon.  . Do not drive while taking narcotics.  TED HOSE   STOCKINGS Wear the elastic stockings on both legs for three weeks following surgery during the day. You may remove them at night for sleeping.  WEIGHT BEARING Weight bearing as tolerated with assist device (walker, cane, etc) as directed, use it as long as suggested by your surgeon or therapist, typically at least 4-6 weeks.  POSTOPERATIVE CONSTIPATION PROTOCOL Constipation - defined medically as fewer than three stools per week and severe constipation as less than one stool per  week.  One of the most common issues patients have following surgery is constipation.  Even if you have a regular bowel pattern at home, your normal regimen is likely to be disrupted due to multiple reasons following surgery.  Combination of anesthesia, postoperative narcotics, change in appetite and fluid intake all can affect your bowels.  In order to avoid complications following surgery, here are some recommendations in order to help you during your recovery period.  . Colace (docusate) - Pick up an over-the-counter form of Colace or another stool softener and take twice a day as long as you are requiring postoperative pain medications.  Take with a full glass of water daily.  If you experience loose stools or diarrhea, hold the colace until you stool forms back up. If your symptoms do not get better within 1 week or if they get worse, check with your doctor. . Dulcolax (bisacodyl) - Pick up over-the-counter and take as directed by the product packaging as needed to assist with the movement of your bowels.  Take with a full glass of water.  Use this product as needed if not relieved by Colace only.  . MiraLax (polyethylene glycol) - Pick up over-the-counter to have on hand. MiraLax is a solution that will increase the amount of water in your bowels to assist with bowel movements.  Take as directed and can mix with a glass of water, juice, soda, coffee, or tea. Take if you go more than two days without a movement. Do not use MiraLax more than once per day. Call your doctor if you are still constipated or irregular after using this medication for 7 days in a row.  If you continue to have problems with postoperative constipation, please contact the office for further assistance and recommendations.  If you experience "the worst abdominal pain ever" or develop nausea or vomiting, please contact the office immediatly for further recommendations for treatment.  ITCHING If you experience itching with your  medications, try taking only a single pain pill, or even half a pain pill at a time.  You can also use Benadryl over the counter for itching or also to help with sleep.   MEDICATIONS See your medication summary on the "After Visit Summary" that the nursing staff will review with you prior to discharge.  You may have some home medications which will be placed on hold until you complete the course of blood thinner medication.  It is important for you to complete the blood thinner medication as prescribed by your surgeon.  Continue your approved medications as instructed at time of discharge.  PRECAUTIONS . If you experience chest pain or shortness of breath - call 911 immediately for transfer to the hospital emergency department.  . If you develop a fever greater that 101 F, purulent drainage from wound, increased redness or drainage from wound, foul odor from the wound/dressing, or calf pain - CONTACT YOUR SURGEON.                                                     FOLLOW-UP APPOINTMENTS Make sure you keep all of your appointments after your operation with your surgeon and caregivers. You should call the office at the above phone number and make an appointment for approximately two weeks after the date of your surgery or on the date instructed by your surgeon outlined in the "After Visit Summary".  RANGE OF MOTION AND STRENGTHENING EXERCISES  Rehabilitation of the knee is important following a knee injury or an operation. After just a few days of immobilization, the muscles of the thigh which control the knee become weakened and shrink (atrophy). Knee exercises are designed to build up the tone and strength of the thigh muscles and to improve knee motion. Often times heat used for twenty to thirty minutes before working out will loosen up your tissues and help with improving the range of motion but do not use heat for the first two weeks following surgery. These exercises can be done on a training  (exercise) mat, on the floor, on a table or on a bed. Use what ever works the best and is most comfortable for you Knee exercises include:  . Leg Lifts - While your knee is still immobilized in a splint or cast, you can do straight leg raises. Lift the leg to 60 degrees, hold for 3 sec, and slowly lower the leg. Repeat 10-20 times 2-3 times daily. Perform this exercise against resistance later as your knee gets better.  . Quad and Hamstring Sets - Tighten up the muscle on the front of the thigh (Quad) and hold for 5-10 sec. Repeat this 10-20 times hourly. Hamstring sets are done by pushing the foot backward against an object and holding for 5-10 sec. Repeat as with quad sets.   Leg Slides: Lying on your back, slowly slide your foot toward your buttocks, bending your knee up off the floor (only go as far as is comfortable). Then slowly slide your foot back down until your leg is flat on the floor again.  Angel Wings: Lying on your back spread your legs to the side as far apart as you can without causing discomfort.  A rehabilitation program following serious knee injuries can speed recovery and prevent re-injury in the future due to weakened muscles. Contact your doctor or a physical therapist for more information on knee rehabilitation.   IF YOU ARE TRANSFERRED TO A SKILLED REHAB FACILITY If the patient is transferred to a skilled rehab facility following release from the hospital, a list of the current medications will be sent to the facility for the patient to continue.  When discharged from the skilled rehab facility, please have the facility set up the patient's Home Health Physical Therapy prior to being released. Also, the skilled facility will be responsible for providing the patient with their medications at time of release from the facility to include their pain medication, the muscle relaxants, and their blood thinner medication. If the patient is still at the rehab facility at time of the two  week follow up appointment, the skilled rehab facility will also need to assist the patient in arranging follow up appointment in our office and any transportation needs.  MAKE SURE YOU:  . Understand these instructions.  . Get help right away if you are not doing well or get worse.   DENTAL ANTIBIOTICS:  In most cases prophylactic antibiotics for Dental procdeures after total joint surgery are not necessary.  Exceptions are as follows:  1. History of prior total joint infection  2. Severely immunocompromised (  Organ Transplant, cancer chemotherapy, Rheumatoid biologic meds such as Humera)  3. Poorly controlled diabetes (A1C &gt; 8.0, blood glucose over 200)  If you have one of these conditions, contact your surgeon for an antibiotic prescription, prior to your dental procedure.    Pick up stool softner and laxative for home use following surgery while on pain medications. Do not submerge incision under water. Please use good hand washing techniques while changing dressing each day. May shower starting three days after surgery. Please use a clean towel to pat the incision dry following showers. Continue to use ice for pain and swelling after surgery. Do not use any lotions or creams on the incision until instructed by your surgeon.  

## 2019-11-25 NOTE — Transfer of Care (Signed)
Immediate Anesthesia Transfer of Care Note  Patient: Brooke Krueger  Procedure(s) Performed: TOTAL KNEE ARTHROPLASTY (Left Knee)  Patient Location: PACU  Anesthesia Type:General and Regional  Level of Consciousness: awake, alert  and oriented  Airway & Oxygen Therapy: Patient Spontanous Breathing and Patient connected to face mask oxygen  Post-op Assessment: Report given to RN and Post -op Vital signs reviewed and stable  Post vital signs: Reviewed and stable  Last Vitals:  Vitals Value Taken Time  BP 154/119 11/25/19 1058  Temp    Pulse 94 11/25/19 1059  Resp 21 11/25/19 1059  SpO2 100 % 11/25/19 1059  Vitals shown include unvalidated device data.  Last Pain:  Vitals:   11/25/19 0711  TempSrc: Oral         Complications: No complications documented.

## 2019-11-25 NOTE — Progress Notes (Signed)
Orthopedic Tech Progress Note Patient Details:  Brooke Krueger [facility] 22-Apr-1940 [id number]  CPM Left Knee CPM Left Knee: On Left Knee Flexion (Degrees): 40 Left Knee Extension (Degrees): 10  Post Interventions Patient Tolerated: Well Instructions Provided: Care of device  [place] Pink 11/25/2019, 12:19 PM

## 2019-11-25 NOTE — Anesthesia Preprocedure Evaluation (Signed)
Anesthesia Evaluation  Patient identified by MRN, date of birth, ID band Patient awake    Reviewed: Allergy & Precautions, NPO status , Patient's Chart, lab work & pertinent test results  Airway Mallampati: I  TM Distance: >3 FB Neck ROM: Full    Dental   Pulmonary former smoker,    Pulmonary exam normal        Cardiovascular hypertension, Pt. on medications Normal cardiovascular exam+ dysrhythmias Atrial Fibrillation      Neuro/Psych    GI/Hepatic GERD  Medicated and Controlled,  Endo/Other  diabetes, Type 2, Oral Hypoglycemic Agents  Renal/GU      Musculoskeletal   Abdominal   Peds  Hematology   Anesthesia Other Findings   Reproductive/Obstetrics                             Anesthesia Physical Anesthesia Plan  ASA: III  Anesthesia Plan: Spinal   Post-op Pain Management:  Regional for Post-op pain   Induction:   PONV Risk Score and Plan: 2 and Ondansetron and Midazolam  Airway Management Planned: Nasal Cannula  Additional Equipment:   Intra-op Plan:   Post-operative Plan:   Informed Consent: I have reviewed the patients History and Physical, chart, labs and discussed the procedure including the risks, benefits and alternatives for the proposed anesthesia with the patient or authorized representative who has indicated his/her understanding and acceptance.       Plan Discussed with: CRNA and Surgeon  Anesthesia Plan Comments:         Anesthesia Quick Evaluation

## 2019-11-26 ENCOUNTER — Encounter (HOSPITAL_COMMUNITY): Payer: Self-pay | Admitting: Orthopedic Surgery

## 2019-11-26 DIAGNOSIS — M1712 Unilateral primary osteoarthritis, left knee: Secondary | ICD-10-CM | POA: Diagnosis not present

## 2019-11-26 LAB — BASIC METABOLIC PANEL
Anion gap: 12 (ref 5–15)
BUN: 12 mg/dL (ref 8–23)
CO2: 24 mmol/L (ref 22–32)
Calcium: 8.9 mg/dL (ref 8.9–10.3)
Chloride: 101 mmol/L (ref 98–111)
Creatinine, Ser: 0.52 mg/dL (ref 0.44–1.00)
GFR, Estimated: >60 mL/min (ref >60)
Glucose, Bld: 128 mg/dL — ABNORMAL HIGH (ref 70–99)
Potassium: 3.9 mmol/L (ref 3.5–5.1)
Sodium: 137 mmol/L (ref 135–145)

## 2019-11-26 LAB — CBC
HCT: 34.7 % — ABNORMAL LOW (ref 36.0–46.0)
Hemoglobin: 11.4 g/dL — ABNORMAL LOW (ref 12.0–15.0)
MCH: 28.2 pg (ref 26.0–34.0)
MCHC: 32.9 g/dL (ref 30.0–36.0)
MCV: 85.9 fL (ref 80.0–100.0)
Platelets: 163 10*3/uL (ref 150–400)
RBC: 4.04 MIL/uL (ref 3.87–5.11)
RDW: 14.8 % (ref 11.5–15.5)
WBC: 13.9 10*3/uL — ABNORMAL HIGH (ref 4.0–10.5)
nRBC: 0 % (ref 0.0–0.2)

## 2019-11-26 MED ORDER — METHOCARBAMOL 500 MG PO TABS: 500.0000 mg | ORAL_TABLET | Freq: Four times a day (QID) | ORAL | 0 refills | Status: AC | PRN

## 2019-11-26 MED ORDER — OXYCODONE HCL 5 MG PO TABS: 5.0000 mg | ORAL_TABLET | ORAL | 0 refills | Status: AC | PRN

## 2019-11-26 MED ORDER — HYDROMORPHONE HCL 2 MG PO TABS: 2.0000 mg | ORAL_TABLET | Freq: Four times a day (QID) | ORAL | 0 refills | Status: AC | PRN

## 2019-11-26 MED ORDER — GABAPENTIN 100 MG PO CAPS: ORAL_CAPSULE | ORAL | 0 refills | Status: AC

## 2019-11-26 MED ORDER — HYDROMORPHONE HCL 2 MG PO TABS: 2.0000 mg | ORAL_TABLET | ORAL | Status: DC | PRN

## 2019-11-26 NOTE — Progress Notes (Signed)
Physical Therapy Treatment Patient Details Name: Brooke Krueger MRN: [id number] DOB: 11-15-1940 Today's Date: 11/26/2019    History of Present Illness Pt is an 79 year old female s/p L TKA with hx of R TKA 11/2018    PT Comments    Pt ambulated in hallway and performed a couple LE exercises.  Pt to practice steps this afternoon.    Follow Up Recommendations  Follow surgeon's recommendation for DC plan and follow-up therapies;Outpatient PT     Equipment Recommendations  None recommended by PT    Recommendations for Other Services       Precautions / Restrictions Precautions Precautions: Fall;Knee Required Braces or Orthoses: Knee Immobilizer - Left Restrictions LLE Weight Bearing: Weight bearing as tolerated    Mobility  Bed Mobility Overal bed mobility: Needs Assistance Bed Mobility: Supine to Sit     Supine to sit: Min assist     General bed mobility comments: assist for L LE, pt self assisted upper body with bed rails  Transfers Overall transfer level: Needs assistance Equipment used: Rolling walker (2 wheeled) Transfers: Sit to/from Stand Sit to Stand: Min guard         General transfer comment: verbal cues for UE and LE positioning  Ambulation/Gait Ambulation/Gait assistance: Min guard Gait Distance (Feet): 60 Feet Assistive device: Rolling walker (2 wheeled) Gait Pattern/deviations: Step-to pattern;Decreased stance time - left;Antalgic     General Gait Details: verbal cues for sequence, RW positioning, use of UEs through RW, pt reports UEs fatigue quickly   Stairs             Wheelchair Mobility    Modified Rankin (Stroke Patients Only)       Balance                                            Cognition Arousal/Alertness: Awake/alert Behavior During Therapy: WFL for tasks assessed/performed Overall Cognitive Status: Within Functional Limits for tasks assessed                                         Exercises Total Joint Exercises Ankle Circles/Pumps: AROM;10 reps Quad Sets: AROM;10 reps Heel Slides: AAROM;Left;10 reps    General Comments        Pertinent Vitals/Pain Pain Assessment: 0-10 Pain Score: 7  Pain Location: left knee Pain Descriptors / Indicators: Grimacing;Sore Pain Intervention(s): Repositioned;Patient requesting pain meds-RN notified;Monitored during session    Home Living                      Prior Function            PT Goals (current goals can now be found in the care plan section) Progress towards PT goals: Progressing toward goals    Frequency    7X/week      PT Plan Current plan remains appropriate    Co-evaluation              AM-PAC PT "6 Clicks" Mobility   Outcome Measure  Help needed turning from your back to your side while in a flat bed without using bedrails?: A Little Help needed moving from lying on your back to sitting on the side of a flat bed without using bedrails?: A Little Help needed moving to  and from a bed to a chair (including a wheelchair)?: A Little Help needed standing up from a chair using your arms (e.g., wheelchair or bedside chair)?: A Little Help needed to walk in hospital room?: A Little Help needed climbing 3-5 steps with a railing? : A Little 6 Click Score: 18    End of Session Equipment Utilized During Treatment: Gait belt Activity Tolerance: Patient tolerated treatment well Patient left: with call bell/phone within reach;in chair Nurse Communication: Patient requests pain meds PT Visit Diagnosis: Other abnormalities of gait and mobility (R26.89)     Time: 7035-0093 PT Time Calculation (min) (ACUTE ONLY): 21 min  Charges:  $Gait Training: 8-22 mins                     Arlyce Dice, DPT Acute Rehabilitation Services Pager: [phone] Office: [place] 11/26/2019, 1:46 PM

## 2019-11-26 NOTE — Progress Notes (Signed)
Pt. seen for CPAP h/s order upon arrival pt. already had placed her on CPAP, setting of 13 cmh20, currently on room air, "do not use oxygen", using her own nasal mask along with own hose, tolerating well, made aware to notify if needed.

## 2019-11-26 NOTE — Progress Notes (Signed)
Subjective: 1 Day Post-Op Procedure(s) (LRB): TOTAL KNEE ARTHROPLASTY (Left) Patient reports pain as mild.   Patient seen in rounds by Dr. Wynelle Link. Patient had issues with nausea yesterday, this is improved this AM. Pain controlled with current medication regimen. Denies chest pain, SOB, or calf pain. Foley catheter to be removed.  We will continue therapy today.   Objective: Vital signs in last 24 hours: Temp:  [97.5 F (36.4 C)-99 F (37.2 C)] 97.8 F (36.6 C) (10/12 0506) Pulse Rate:  [70-97] 88 (10/12 0506) Resp:  [13-24] 14 (10/12 0506) BP: (119-166)/(74-107) 120/81 (10/12 0506) SpO2:  [95 %-100 %] 95 % (10/12 0506) Weight:  [96.2 kg] 96.2 kg (10/11 1300)  Intake/Output from previous day:  Intake/Output Summary (Last 24 hours) at 11/26/2019 0724 Last data filed at 11/26/2019 0606 Gross per 24 hour  Intake 2822.04 ml  Output 1900 ml  Net 922.04 ml     Intake/Output this shift: No intake/output data recorded.  Labs: Recent Labs    11/26/19 0320  HGB 11.4*   Recent Labs    11/26/19 0320  WBC 13.9*  RBC 4.04  HCT 34.7*  PLT 163   Recent Labs    11/26/19 0320  NA 137  K 3.9  CL 101  CO2 24  BUN 12  CREATININE 0.52  GLUCOSE 128*  CALCIUM 8.9   No results for input(s): LABPT, INR in the last 72 hours.  Exam: General - Patient is Alert and Oriented Extremity - Neurologically intact Neurovascular intact Sensation intact distally Dorsiflexion/Plantar flexion intact Dressing - C/D/I, no drainage Motor Function - intact, moving foot and toes well on exam.   Past Medical History:  Diagnosis Date  . Abdominal fibromatosis   . Arthritis   . Atrial fibrillation ([place])   . Breast cancer ([place]) 2002   right lumpectomy  . Chronic constipation   . Diabetes mellitus without complication ([place])    no meds pt. denies ever being told this  . Diverticulosis   . Dysrhythmia    AFIB  . Endometrial cancer ([place]) 2013  . Epistaxis    FOR CAUTERY IN NOV.    Mar[place] Kitchen GERD (gastroesophageal reflux disease)   . Heart murmur    pt. denies   . History of adenomatous polyp of colon   . History of kidney stones   . Hyperlipemia   . Hypertension   . Hyperuricemia   . Nose abnormality    BLEEDS, CAUTERIZATION RIGHT SIDE 09/25/17  . Personal history of radiation therapy 2002   right breast ca  . Sleep apnea    cpap    Assessment/Plan: 1 Day Post-Op Procedure(s) (LRB): TOTAL KNEE ARTHROPLASTY (Left) Active Problems:   Primary osteoarthritis of left knee  Estimated body mass index is 38.78 kg/m as calculated from the following:   Height as of this encounter: 5' 2.01" (1.575 m).   Weight as of this encounter: 96.2 kg. Advance diet Up with therapy D/C IV fluids   Patient's anticipated LOS is less than 2 midnights, meeting these requirements: - Lives within 1 hour of care - Has a competent adult at home to recover with post-op recover - NO history of  - Chronic pain requiring opioids  - Diabetes  - Coronary Artery Disease  - Heart failure  - Heart attack  - Stroke  - DVT/VTE  - Respiratory Failure/COPD  - Renal failure  - Anemia  - Advanced Liver disease  DVT Prophylaxis - Xarelto Weight bearing as tolerated. Continue therapy.  Plan is to go Home after hospital stay. Plan for discharge later today if cleared by physical therapy. Scheduled for OPP[facility] at [facility] Follow-up in the office October 26th.  The PDMP database was reviewed today (11/26/2019) prior to any opioid medications being prescribed to this patient.   Theresa Duty, PA-C Orthopedic Surgery [phone] 11/26/2019, 7:24 AM

## 2019-11-26 NOTE — Progress Notes (Signed)
Physical Therapy Treatment Patient Details Name: Brooke Krueger MRN: [id number] DOB: 04/07/1940 Today's Date: 11/26/2019    History of Present Illness Pt is an 79 year old female s/p L TKA with hx of R TKA 11/2018    PT Comments    Pt reports increased pain, feels pain meds are not helping.  Pt declined waiting for more pain meds prior to session, stating she just wanted to d/c home.   Portable stairs brought to pt's room since pt felt unable to tolerate ambulating.  Pt able to perform steps min/guard for safety.   Pt reports pain not controlled however still wishes to d/c home.  Pt educated on use of KI.  Pt's RN at lunch so discussed pt's pain with charge RN.   Follow Up Recommendations  Follow surgeon's recommendation for DC plan and follow-up therapies;Outpatient PT     Equipment Recommendations  None recommended by PT    Recommendations for Other Services       Precautions / Restrictions Precautions Precautions: Fall;Knee Required Braces or Orthoses: Knee Immobilizer - Left Restrictions LLE Weight Bearing: Weight bearing as tolerated    Mobility  Bed Mobility Overal bed mobility: Needs Assistance Bed Mobility: Supine to Sit     Supine to sit: Min assist     General bed mobility comments: up in recliner  Transfers Overall transfer level: Needs assistance Equipment used: Rolling walker (2 wheeled) Transfers: Sit to/from Stand Sit to Stand: Min guard         General transfer comment: verbal cues for UE and LE positioning  Ambulation/Gait Ambulation/Gait assistance: Min guard Gait Distance (Feet): 16 Feet Assistive device: Rolling walker (2 wheeled) Gait Pattern/deviations: Step-to pattern;Decreased stance time - left;Antalgic     General Gait Details: verbal cues for sequence, RW positioning, use of UEs through RW, short distance due to pain   Stairs Stairs: Yes Stairs assistance: Min guard Stair Management: Step to pattern;Forwards;Two  rails Number of Stairs: 2 General stair comments: pt recalls correct sequence, min/guard for safety however no physical assist required   Wheelchair Mobility    Modified Rankin (Stroke Patients Only)       Balance                                            Cognition Arousal/Alertness: Awake/alert Behavior During Therapy: WFL for tasks assessed/performed Overall Cognitive Status: Within Functional Limits for tasks assessed                                        Exercises Total Joint Exercises Ankle Circles/Pumps: AROM;10 reps Quad Sets: AROM;10 reps Heel Slides: AAROM;Left;10 reps    General Comments        Pertinent Vitals/Pain Pain Assessment: 0-10 Pain Score: 8  Pain Location: left knee Pain Descriptors / Indicators: Grimacing;Sore Pain Intervention(s): Monitored during session;Repositioned (declined waiting for pain meds)    Home Living                      Prior Function            PT Goals (current goals can now be found in the care plan section) Progress towards PT goals: Progressing toward goals    Frequency    7X/week  PT Plan Current plan remains appropriate    Co-evaluation              AM-PAC PT "6 Clicks" Mobility   Outcome Measure  Help needed turning from your back to your side while in a flat bed without using bedrails?: A Little Help needed moving from lying on your back to sitting on the side of a flat bed without using bedrails?: A Little Help needed moving to and from a bed to a chair (including a wheelchair)?: A Little Help needed standing up from a chair using your arms (e.g., wheelchair or bedside chair)?: A Little Help needed to walk in hospital room?: A Little Help needed climbing 3-5 steps with a railing? : A Little 6 Click Score: 18    End of Session Equipment Utilized During Treatment: Gait belt Activity Tolerance: Patient tolerated treatment well Patient left:  with call bell/phone within reach;in chair;with chair alarm set Nurse Communication: Patient requests pain meds PT Visit Diagnosis: Other abnormalities of gait and mobility (R26.89)     Time: 4417-1278 PT Time Calculation (min) (ACUTE ONLY): 24 min  Charges:  $Gait Training: 8-22 mins                    Arlyce Dice, DPT Acute Rehabilitation Services Pager: [phone] Office: [place] 11/26/2019, 3:43 PM

## 2019-11-26 NOTE — Progress Notes (Signed)
Orthopedic Tech Progress Note Patient Details:  Amariona Rathje [facility] 1940/02/27 [phone] Pick up cpm Patient ID: Innocence Schlotzhauer, female   DOB: 1940-04-05, 79 y.o.   MRN: [id number]   Braulio Bosch 11/26/2019, 3:40 PM

## 2019-11-26 NOTE — TOC Transition Note (Signed)
Transition of Care [facility]) - CM/SW Discharge Note   Patient Details  Name: Brooke Krueger MRN: [id number] Date of Birth: 01-15-1941  Transition [facility] Care [facility]) CM/SW Contact:  Lia Hopping, LCSW Phone Number: 11/26/2019, 12:33 PM   Clinical Narrative:    Therapy Plan:OPPT-[place].  Patient confirmed she has DME   Final next level of care: OP Rehab Barriers to Discharge: No Barriers Identified   Patient Goals and CMS Choice        Discharge Placement                       Discharge Plan and Services                                     Social Determinants of Health (SDOH) Interventions     Readmission Risk Interventions No flowsheet data found.

## 2019-11-26 NOTE — Progress Notes (Deleted)
Pt. s/u with and placed on BiPAP DS/Auto Bipap <4/>16 with 2 lpm oxygen added into circuit per pts. current setting, RN aware, pt. Tolerating well, made aware to notify if needed.

## 2019-11-27 NOTE — Discharge Summary (Signed)
Physician Discharge Summary   Patient ID: Brooke Krueger MRN: [id number] DOB/AGE: Jul 24, 1940 79 y.o.  Admit date: 11/25/2019 Discharge date: 11/26/2019  Primary Diagnosis: Osteoarthritis, right knee   Admission Diagnoses:  Past Medical History:  Diagnosis Date  . Abdominal fibromatosis   . Arthritis   . Atrial fibrillation ([place])   . Breast cancer ([place]) 2002   right lumpectomy  . Chronic constipation   . Diabetes mellitus without complication ([place])    no meds pt. denies ever being told this  . Diverticulosis   . Dysrhythmia    AFIB  . Endometrial cancer ([place]) 2013  . Epistaxis    FOR CAUTERY IN NOV.  Marland Kitchen GERD (gastroesophageal reflux disease)   . Heart murmur    pt. denies   . History of adenomatous polyp of colon   . History of kidney stones   . Hyperlipemia   . Hypertension   . Hyperuricemia   . Nose abnormality    BLEEDS, CAUTERIZATION RIGHT SIDE 09/25/17  . Personal history of radiation therapy 2002   right breast ca  . Sleep apnea    cpap   Discharge Diagnoses:   Active Problems:   Primary osteoarthritis of left knee  Estimated body mass index is 38.78 kg/m as calculated from the following:   Height as of this encounter: 5' 2.01" (1.575 m).   Weight as of this encounter: 96.2 kg.  Procedure:  Procedure(s) (LRB): TOTAL KNEE ARTHROPLASTY (Left)   Consults: None  HPI: Brooke Krueger is a 79 y.o. year old female with end stage OA of her left knee with progressively worsening pain and dysfunction. She has constant pain, with activity and at rest and significant functional deficits with difficulties even with ADLs. She has had extensive non-op management including analgesics, injections of cortisone and viscosupplements, and home exercise program, but remains in significant pain with significant dysfunction. Radiographs show bone on bone arthritis medial and patellofemoral. She presents now for left Total Knee Arthroplasty.    Laboratory Data: Admission  on 11/25/2019, Discharged on 11/26/2019  Component Date Value Ref Range Status  . ABO/RH(D) 11/25/2019 A POS   Final  . Antibody Screen 11/25/2019 NEG   Final  . Sample Expiration 11/25/2019    Final                   Value:11/28/2019,2359 Performed at [facility], [place] [address] [place], [place] [address]   . Glucose-Capillary 11/25/2019 99  70 - 99 mg/dL Final   Glucose reference range applies only to samples taken after fasting for at least 8 hours.  . WBC 11/26/2019 13.9* 4.0 - 10.5 K/uL Final  . RBC 11/26/2019 4.04  3.87 - 5.11 MIL/uL Final  . Hemoglobin 11/26/2019 11.4* 12.0 - 15.0 g/dL Final  . HCT 11/26/2019 34.7* 36 - 46 % Final  . MCV 11/26/2019 85.9  80.0 - 100.0 fL Final  . MCH 11/26/2019 28.2  26.0 - 34.0 pg Final  . MCHC 11/26/2019 32.9  30.0 - 36.0 g/dL Final  . RDW 11/26/2019 14.8  11.5 - 15.5 % Final  . Platelets 11/26/2019 163  150 - 400 K/uL Final  . nRBC 11/26/2019 0.0  0.0 - 0.2 % Final   Performed at [facility], [place] [address] [place], [place] [address]  . Sodium 11/26/2019 137  135 - 145 mmol/L Final  . Potassium 11/26/2019 3.9  3.5 - 5.1 mmol/L Final  . Chloride 11/26/2019 101  98 - 111  mmol/L Final  . CO2 11/26/2019 24  22 - 32 mmol/L Final  . Glucose, Bld 11/26/2019 128* 70 - 99 mg/dL Final   Glucose reference range applies only to samples taken after fasting for at least 8 hours.  . BUN 11/26/2019 12  8 - 23 mg/dL Final  . Creatinine, Ser 11/26/2019 0.52  0.44 - 1.00 mg/dL Final  . Calcium 11/26/2019 8.9  8.9 - 10.3 mg/dL Final  . GFR, Estimated 11/26/2019 >60  >60 mL/min Final  . Anion gap 11/26/2019 12  5 - 15 Final   Performed at [facility], [place], [place] [address], [place], [place] [address]  Hospital Outpatient Visit on 11/22/2019  Component Date Value Ref Range Status  . SARS Coronavirus 2 11/22/2019 NEGATIVE  NEGATIVE Final   Comment: (NOTE) SARS-CoV-2 target nucleic acids are NOT  DETECTED.  The SARS-CoV-2 RNA is generally detectable in upper and lower respiratory specimens during the acute phase of infection. Negative results do not preclude SARS-CoV-2 infection, do not rule out co-infections with other pathogens, and should not be used as the sole basis for treatment or other patient management decisions. Negative results must be combined with clinical observations, patient history, and epidemiological information. The expected result is Negative.  Fact Sheet for Patients: SugarRoll.be  Fact Sheet for Healthcare Providers: https://www.woods-mathews.com/  This test is not yet approved or cleared by the [place] FDA and  has been authorized for detection and/or diagnosis of SARS-CoV-2 by FDA under an Emergency Use Authorization (EUA). This EUA will remain  in effect (meaning this test can be used) for the duration of the COVID-19 declaration under Se                          ction 564(b)(1) of the Act, 21 U.S.C. section 360bbb-3(b)(1), unless the authorization is terminated or revoked sooner.  Performed at [facility], [place] [address] [place], [place] [address]   Hospital Outpatient Visit on 11/21/2019  Component Date Value Ref Range Status  . MRSA, PCR 11/21/2019 NEGATIVE  NEGATIVE Final  . Staphylococcus aureus 11/21/2019 NEGATIVE  NEGATIVE Final   Comment: (NOTE) The Xpert SA Assay (FDA approved for NASAL specimens in patients 36 years of age and older), is one component of a comprehensive surveillance program. It is not intended to diagnose infection nor to guide or monitor treatment. Performed at [facility], [place] [address], [place], [place] [address]   . aPTT 11/21/2019 42* 24 - 36 seconds Final   Comment:        IF BASELINE aPTT IS ELEVATED, SUGGEST PATIENT RISK ASSESSMENT BE USED TO DETERMINE APPROPRIATE ANTICOAGULANT THERAPY. Performed at [facility], [place] [address] [place], [place] [address]   . WBC 11/21/2019 7.5  4.0 - 10.5 K/uL Final  . RBC 11/21/2019 4.69  3.87 - 5.11 MIL/uL Final  . Hemoglobin 11/21/2019 13.0  12.0 - 15.0 g/dL Final  . HCT 11/21/2019 43.0  36 - 46 % Final  . MCV 11/21/2019 91.7  80.0 - 100.0 fL Final  . MCH 11/21/2019 27.7  26.0 - 34.0 pg Final  . MCHC 11/21/2019 30.2  30.0 - 36.0 g/dL Final  . RDW 11/21/2019 15.0  11.5 - 15.5 % Final  . Platelets 11/21/2019 188  150 - 400 K/uL Final  . nRBC 11/21/2019 0.0  0.0 - 0.2 % Final   Performed at [facility], [place] [address] [place],  [place] [address]  . Sodium 11/21/2019 142  135 - 145 mmol/L Final  . Potassium 11/21/2019 3.7  3.5 - 5.1 mmol/L Final  . Chloride 11/21/2019 102  98 - 111 mmol/L Final  . CO2 11/21/2019 26  22 - 32 mmol/L Final  . Glucose, Bld 11/21/2019 83  70 - 99 mg/dL Final   Glucose reference range applies only to samples taken after fasting for at least 8 hours.  . BUN 11/21/2019 15  8 - 23 mg/dL Final  . Creatinine, Ser 11/21/2019 0.55  0.44 - 1.00 mg/dL Final  . Calcium 11/21/2019 9.9  8.9 - 10.3 mg/dL Final  . Total Protein 11/21/2019 7.4  6.5 - 8.1 g/dL Final  . Albumin 11/21/2019 4.0  3.5 - 5.0 g/dL Final  . AST 11/21/2019 18  15 - 41 U/L Final  . ALT 11/21/2019 15  0 - 44 U/L Final  . Alkaline Phosphatase 11/21/2019 85  38 - 126 U/L Final  . Total Bilirubin 11/21/2019 1.2  0.3 - 1.2 mg/dL Final  . GFR calc non Af Amer 11/21/2019 >60  >60 mL/min Final  . Anion gap 11/21/2019 14  5 - 15 Final   Performed at [facility], [place] [address], [place], [place] [address]  . Prothrombin Time 11/21/2019 18.0* 11.4 - 15.2 seconds Final  . INR 11/21/2019 1.5* 0.8 - 1.2 Final   Comment: (NOTE) INR goal varies based on device and disease states. Performed at [facility], [place] [address] [place], [place] [address]      X-Rays:No results found.  EKG:No orders found for this or  any previous visit.   Hospital Course: Iliza Blankenbeckler is a 79 y.o. who was admitted to [facility]. They were brought to the operating room on 11/25/2019 and underwent Procedure(s): TOTAL KNEE ARTHROPLASTY.  Patient tolerated the procedure well and was later transferred to the recovery room and then to the orthopaedic floor for postoperative care. They were given PO and IV analgesics for pain control following their surgery. They were given 24 hours of postoperative antibiotics of  Anti-infectives (From admission, onward)   Start     Dose/Rate Route Frequency Ordered Stop   11/25/19 1530  ceFAZolin (ANCEF) IVPB 2g/100 mL premix        2 g 200 mL/hr over 30 Minutes Intravenous Every 6 hours 11/25/19 1251 11/25/19 2049   11/25/19 0715  ceFAZolin (ANCEF) IVPB 2g/100 mL premix        2 g 200 mL/hr over 30 Minutes Intravenous On call to O.R. 11/25/19 0701 11/25/19 0933     and started on DVT prophylaxis in the form of Xarelto.   PT and OT were ordered for total joint protocol. Discharge planning consulted to help with postop disposition and equipment needs.  Patient had a good night on the evening of surgery. They started to get up OOB with therapy on POD #0. Pt was seen during rounds and was ready to go home pending progress with therapy. Pain was not controlled with PO oxycodone, changed to PO dilaudid with relief. She worked with therapy on POD #1 and was meeting her goals. Pt was discharged to home later that day in stable condition.  Diet: Cardiac diet Activity: WBAT Follow-up: in 2 weeks Disposition: Home with OPPT at [facility]) Discharged Condition: stable   Discharge Instructions    Call MD / Call 911   Complete by: As directed    If you experience chest pain or  shortness of breath, CALL 911 and be transported to the hospital emergency room.  If you develope a fever above 101 F, pus (white drainage) or increased drainage or redness at the wound, or calf pain, call your  surgeon's office.   Change dressing   Complete by: As directed    You may remove the bulky bandage (ACE wrap and gauze) two days after surgery. You will have an adhesive waterproof bandage underneath. Leave this in place until your first follow-up appointment.   Constipation Prevention   Complete by: As directed    Drink plenty of fluids.  Prune juice may be helpful.  You may use a stool softener, such as Colace (over the counter) 100 mg twice a day.  Use MiraLax (over the counter) for constipation as needed.   Diet - low sodium heart healthy   Complete by: As directed    Do not put a pillow under the knee. Place it under the heel.   Complete by: As directed    Driving restrictions   Complete by: As directed    No driving for two weeks   TED hose   Complete by: As directed    Use stockings (TED hose) for three weeks on both leg(s).  You may remove them at night for sleeping.   Weight bearing as tolerated   Complete by: As directed      Allergies as of 11/26/2019      Reactions   Aspirin Anaphylaxis, Other (See Comments)   Closes throat   Combigan [brimonidine Tartrate-timolol] Shortness Of Breath   respiratory distress   Penicillins Rash, Other (See Comments)   Has patient had a PCN reaction causing immediate rash, facial/tongue/throat swelling, SOB or lightheadedness with hypotension: yes Has patient had a PCN reaction causing severe rash involving mucus membranes or skin necrosis: no Has patient had a PCN reaction that required hospitalization: no Has patient had a PCN reaction occurring within the last 10 years: no If all of the above answers are "NO", then may proceed with Cephalosporin use. Tolerated Cephalosporin Date: 11/26/19.      Medication List    TAKE these medications   acetaminophen 500 MG tablet Commonly known as: TYLENOL Take 1,000 mg by mouth every 6 (six) hours as needed (pain/headaches.).   atorvastatin 20 MG tablet Commonly known as: LIPITOR Take 20 mg  by mouth every evening.   calcium-vitamin D 500-200 MG-UNIT tablet Commonly known as: OSCAL WITH D Take 1 tablet by mouth 2 (two) times daily.   Cranberry 500 MG Tabs Take 500 mg by mouth daily.   [employer] D3 50 MCG (2000 UT) Caps Generic drug: Cholecalciferol Take 2,000 Units by mouth daily.   docusate sodium 100 MG capsule Commonly known as: COLACE Take 100 mg by mouth 2 (two) times daily.   gabapentin 100 MG capsule Commonly known as: NEURONTIN Take a 100 mg capsule three times a day for two weeks following surgery.Then take a 100 mg capsule two times a day for two weeks. Then take a 100 mg capsule once a day for two weeks. Then discontinue.   hydrochlorothiazide 25 MG tablet Commonly known as: HYDRODIURIL Take 25 mg by mouth daily.   HYDROmorphone 2 MG tablet Commonly known as: DILAUDID Take 1-2 tablets (2-4 mg total) by mouth every 6 (six) hours as needed for moderate pain or severe pain.   Lumigan 0.01 % Soln Generic drug: bimatoprost Place 1 drop into both eyes at bedtime.   methocarbamol 500 MG tablet  Commonly known as: ROBAXIN Take 1 tablet (500 mg total) by mouth every 6 (six) hours as needed for muscle spasms.   metoprolol succinate 50 MG 24 hr tablet Commonly known as: TOPROL-XL Take 50 mg by mouth daily.   oxybutynin 15 MG 24 hr tablet Commonly known as: DITROPAN XL Take 15 mg by mouth daily.   oxyCODONE 5 MG immediate release tablet Commonly known as: Oxy IR/ROXICODONE Take 1-2 tablets (5-10 mg total) by mouth every 4 (four) hours as needed for severe pain.   tamsulosin 0.4 MG Caps capsule Commonly known as: FLOMAX tamsulosin 0.4 mg capsule  TAKE 1 CAPSULE (0.4 MG TOTAL) BY MOUTH ONCE DAILY TAKE 30 MINUTES AFTER SAME MEAL EACH DAY.   traMADol 50 MG tablet Commonly known as: ULTRAM Take 50-100 mg by mouth every 6 (six) hours as needed for pain.   Xarelto 20 MG Tabs tablet Generic drug: rivaroxaban Take 20 mg by mouth daily with supper.             Discharge Care Instructions  (From admission, onward)         Start     Ordered   11/26/19 0000  Weight bearing as tolerated        11/26/19 0729   11/26/19 0000  Change dressing       Comments: You may remove the bulky bandage (ACE wrap and gauze) two days after surgery. You will have an adhesive waterproof bandage underneath. Leave this in place until your first follow-up appointment.   11/26/19 0729          Follow-up Information    Gaynelle Arabian, MD. Schedule an appointment as soon as possible for a visit on 12/10/2019.   Specialty: Orthopedic Surgery Contact information: [address] [place] [address] [phone]               Signed: Theresa Duty, PA-C Orthopedic Surgery 11/27/2019, 8:42 AM

## 2020-01-31 ENCOUNTER — Other Ambulatory Visit: Payer: Self-pay | Admitting: Internal Medicine

## 2020-01-31 ENCOUNTER — Ambulatory Visit: Payer: Medicare Other | Attending: Internal Medicine

## 2020-01-31 DIAGNOSIS — Z23 Encounter for immunization: Secondary | ICD-10-CM

## 2020-01-31 NOTE — Progress Notes (Signed)
° °  Covid-19 Vaccination Clinic  Name:  Brooke Krueger    MRN: [id number] DOB: Jul 13, 1940  01/31/2020  [name] was observed post Covid-19 immunization for 15 minutes without incident. She was provided with Vaccine Information Sheet and instruction to access the V-Safe system.   [name] was instructed to call 911 with any severe reactions post vaccine:  Difficulty breathing   Swelling of face and throat   A fast heartbeat   A bad rash all over body   Dizziness and weakness   Immunizations Administered    Name Date Dose VIS Date Route   Pfizer COVID-19 Vaccine 01/31/2020 10:12 AM 0.3 mL 12/04/2019 Intramuscular   Manufacturer: [place]   Lot: [id number]   [place]: [address]-3

## 2020-05-18 ENCOUNTER — Other Ambulatory Visit: Payer: Self-pay | Admitting: Infectious Diseases

## 2020-05-18 DIAGNOSIS — Z1231 Encounter for screening mammogram for malignant neoplasm of breast: Secondary | ICD-10-CM

## 2020-05-29 ENCOUNTER — Other Ambulatory Visit: Payer: Self-pay

## 2020-05-29 ENCOUNTER — Ambulatory Visit
Admission: RE | Admit: 2020-05-29 | Discharge: 2020-05-29 | Disposition: A | Discharge status: Home / Self Care | Payer: Medicare Other | Source: Ambulatory Visit | Attending: Infectious Diseases | Admitting: Infectious Diseases

## 2020-05-29 DIAGNOSIS — Z1231 Encounter for screening mammogram for malignant neoplasm of breast: Secondary | ICD-10-CM | POA: Diagnosis present

## 2020-08-21 ENCOUNTER — Ambulatory Visit: Payer: Medicare Other

## 2021-06-01 ENCOUNTER — Other Ambulatory Visit: Payer: Self-pay | Admitting: Infectious Diseases

## 2021-06-01 DIAGNOSIS — Z1231 Encounter for screening mammogram for malignant neoplasm of breast: Secondary | ICD-10-CM

## 2021-07-07 ENCOUNTER — Ambulatory Visit
Admission: RE | Admit: 2021-07-07 | Discharge: 2021-07-07 | Disposition: A | Discharge status: Home / Self Care | Payer: Medicare Other | Source: Ambulatory Visit | Attending: Infectious Diseases | Admitting: Infectious Diseases

## 2021-07-07 DIAGNOSIS — Z1231 Encounter for screening mammogram for malignant neoplasm of breast: Secondary | ICD-10-CM | POA: Insufficient documentation

## 2021-08-21 IMAGING — CT CT RENAL STONE PROTOCOL
2 of 4 series · 16 of 46 positions shown, 18 images · non-contrast
Comparison: None.

CLINICAL DATA: Intermittent gross hematuria with low back pain for
1 month. History of nephrolithiasis. Remote history of right breast
cancer. Prior uterine cancer status posthysterectomy in 3008.
Appendectomy.

EXAM:
CT ABDOMEN AND PELVIS WITHOUT CONTRAST
TECHNIQUE: Multidetector CT imaging of the abdomen and pelvis was performed
following the standard protocol without IV contrast.

[Series 2: renal stone 5.00 · axial · 0.77mm/px · z∈[-1461,-1061]mm · 13 of 88 slices shown, 15 images]
[im 4/88  soft-tissue]
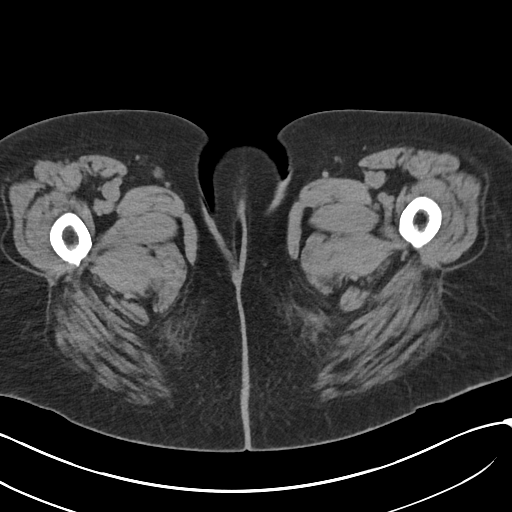
[im 4/88  bone]
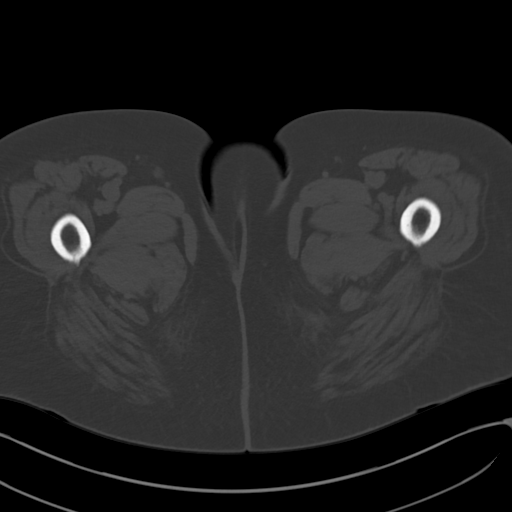
[im 11/88  soft-tissue]
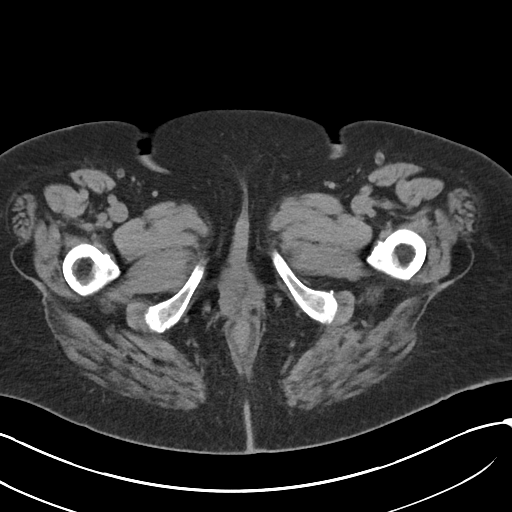
[im 19/88  soft-tissue]
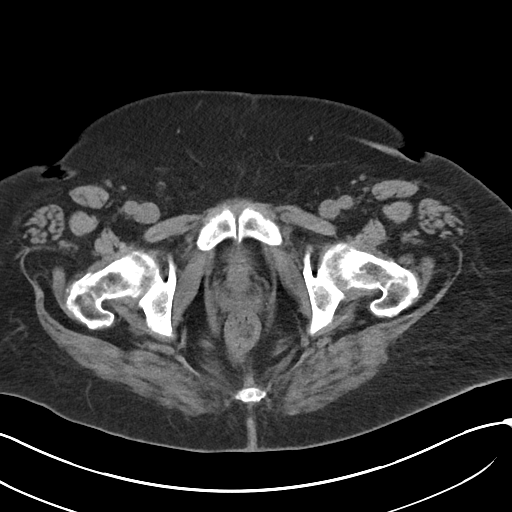
[im 26/88  soft-tissue]
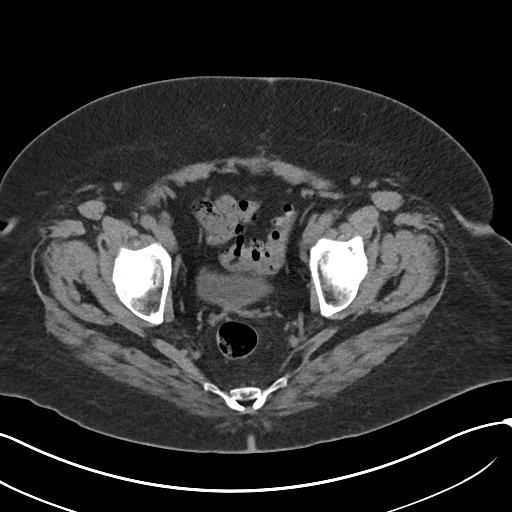
[im 30/88  soft-tissue]
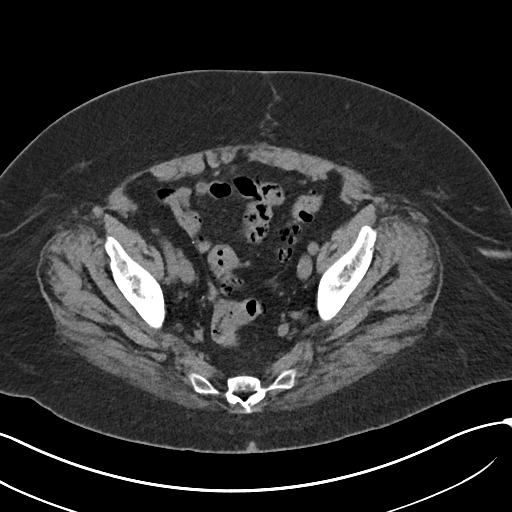
[im 37/88  soft-tissue]
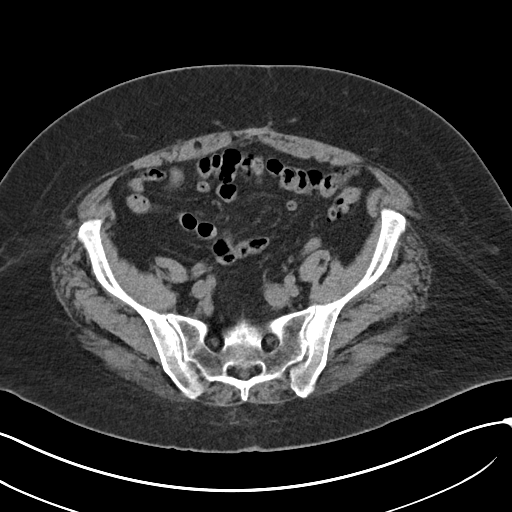
[im 44/88  soft-tissue]
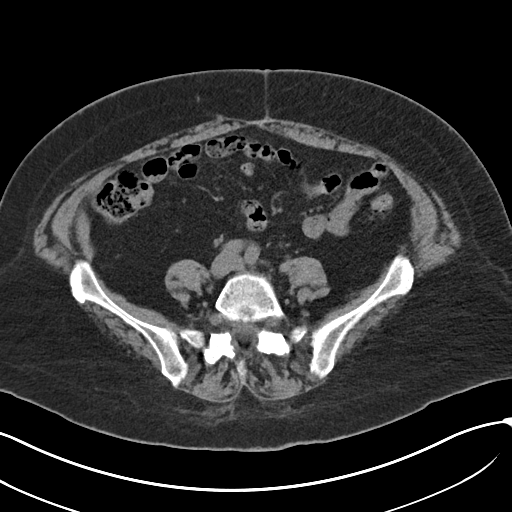
[im 51/88  soft-tissue]
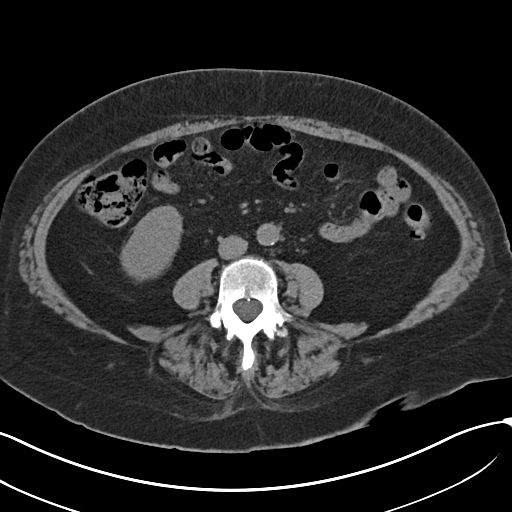
[im 59/88  soft-tissue]
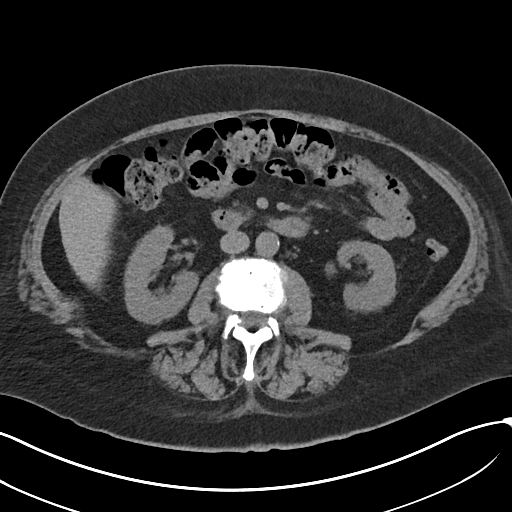
[im 59/88  bone]
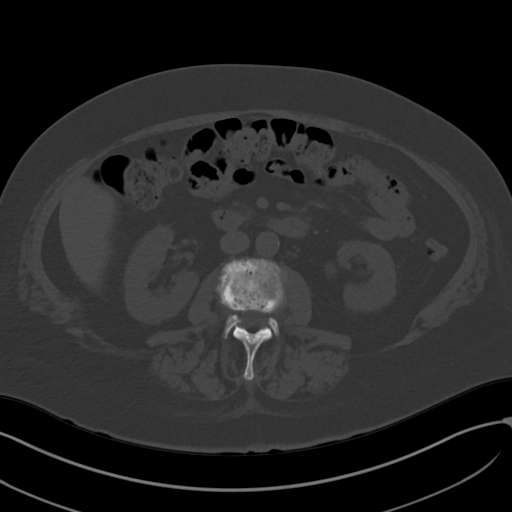
[im 62/88  soft-tissue]
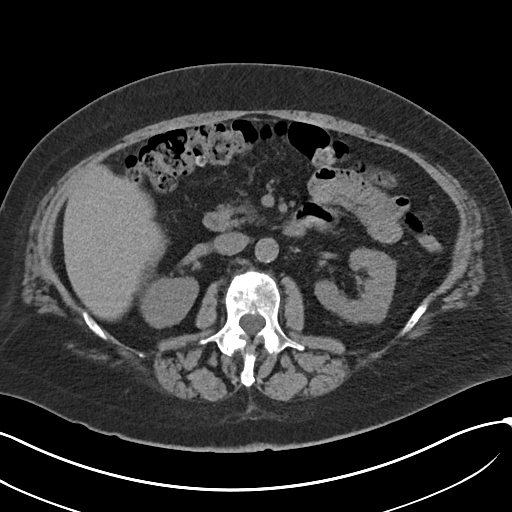
[im 69/88  soft-tissue]
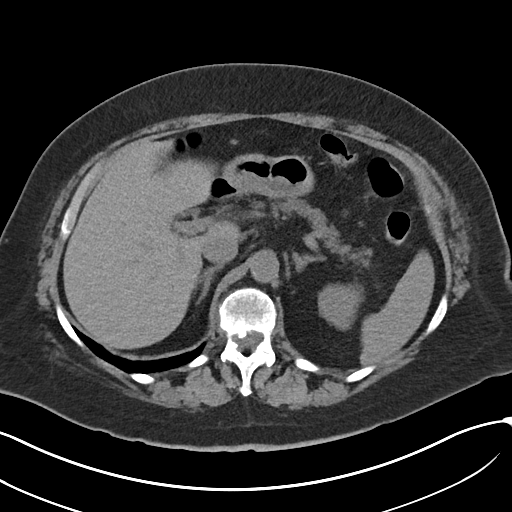
[im 77/88  soft-tissue]
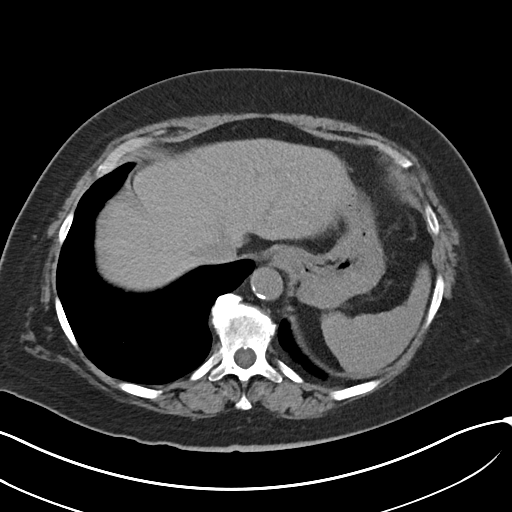
[im 84/88  soft-tissue]
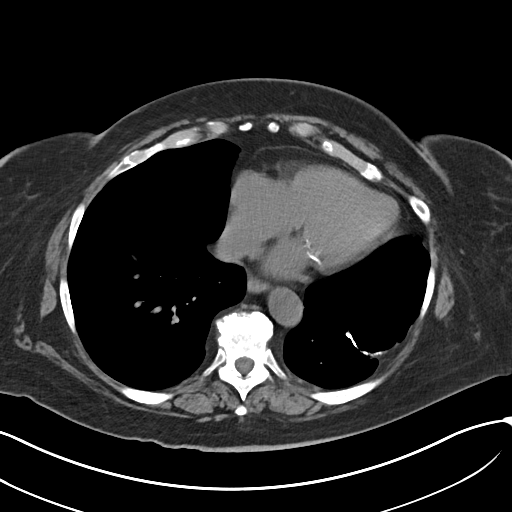

[Series 5: renal stone 2.00 cor · coronal · 0.77mm/px · 3 of 152 slices shown]
[im 51/152  soft-tissue]
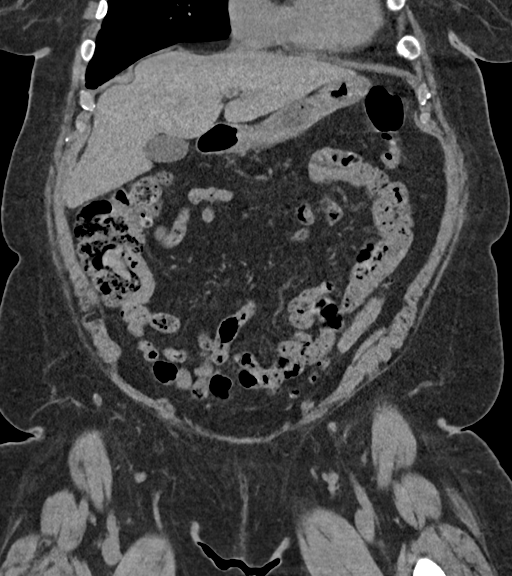
[im 68/152  soft-tissue]
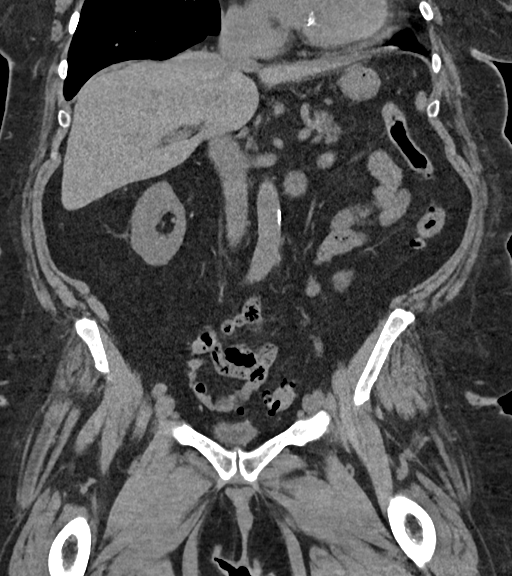
[im 84/152  soft-tissue]
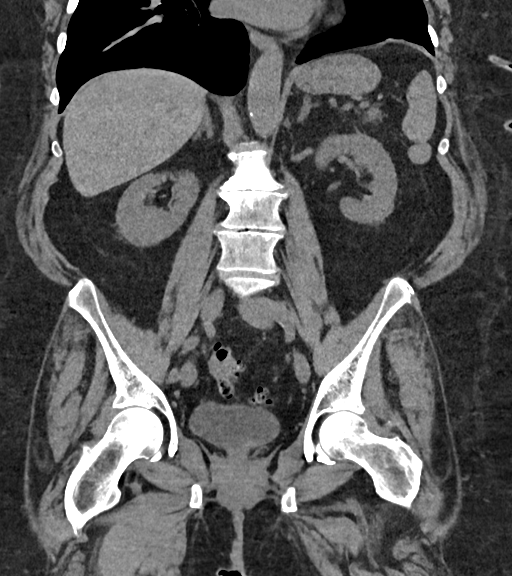

[16 of 46 positions shown; findings below may reference images not displayed]

FINDINGS: Lower chest: Surgical sutures noted at the left lung base. No acute
abnormality at the lung bases.

Hepatobiliary: Normal liver size. Subcentimeter hypodense segment 4A
left liver lobe lesion is too small to characterize. No additional
liver lesions. Normal gallbladder with no radiopaque cholelithiasis.
No biliary ductal dilatation.

Pancreas: Normal, with no mass or duct dilation.

Spleen: Normal size. No mass.

Adrenals/Urinary Tract: Normal adrenals. Punctate nonobstructing
lower right renal stone. Punctate nonobstructing interpolar and
lower left renal stones. No hydronephrosis. Simple 1.2 cm lower left
renal cyst. Otherwise no contour deforming renal masses. Normal
caliber ureters. There is a 2 mm stone at the left ureterovesical
junction. Otherwise no ureteral stones. Normal nondistended bladder.

Stomach/Bowel: Normal non-distended stomach. Normal caliber small
bowel with no small bowel wall thickening. Appendectomy. Moderate
left colonic diverticulosis, with no large bowel wall thickening or
significant pericolonic fat stranding.

Vascular/Lymphatic: Atherosclerotic nonaneurysmal abdominal aorta.
No pathologically enlarged lymph nodes in the abdomen or pelvis.

Reproductive: Status post hysterectomy, with no abnormal findings at
the vaginal cuff. No adnexal mass.

Other: No pneumoperitoneum, ascites or focal fluid collection.

Musculoskeletal: No aggressive appearing focal osseous lesions.
Marked thoracolumbar spondylosis.
IMPRESSION: 1. Left ureterovesical junction 2 mm stone. Normal caliber ureters.
No hydronephrosis. Punctate nonobstructing bilateral
nephrolithiasis.
2. Moderate left colonic diverticulosis.
3. Subcentimeter low-attenuation segment 4A left liver lobe lesion,
indeterminate, low suspicion. Given the history of malignancy,
follow-up CT or MRI abdomen in 3-6 months may be considered. This
recommendation follows ACR consensus guidelines: Managing Incidental
Findings on Abdominal CT: White Paper of the ACR Incidental Findings
Committee. [HOSPITAL] [id number];[DATE]
4.  Aortic Atherosclerosis (S62GK-I92.2).

## 2022-07-04 ENCOUNTER — Other Ambulatory Visit: Payer: Self-pay | Admitting: Infectious Diseases

## 2022-07-04 DIAGNOSIS — Z1231 Encounter for screening mammogram for malignant neoplasm of breast: Secondary | ICD-10-CM

## 2022-07-20 ENCOUNTER — Ambulatory Visit
Admission: RE | Admit: 2022-07-20 | Discharge: 2022-07-20 | Disposition: A | Discharge status: Home / Self Care | Payer: Medicare Other | Source: Ambulatory Visit | Attending: Infectious Diseases | Admitting: Infectious Diseases

## 2022-07-20 DIAGNOSIS — Z1231 Encounter for screening mammogram for malignant neoplasm of breast: Secondary | ICD-10-CM

## 2023-07-26 ENCOUNTER — Other Ambulatory Visit: Payer: Self-pay | Admitting: Infectious Diseases

## 2023-07-26 DIAGNOSIS — M542 Cervicalgia: Secondary | ICD-10-CM

## 2023-07-27 ENCOUNTER — Other Ambulatory Visit: Payer: Self-pay | Admitting: Infectious Diseases

## 2023-07-27 DIAGNOSIS — Z1231 Encounter for screening mammogram for malignant neoplasm of breast: Secondary | ICD-10-CM

## 2023-08-09 ENCOUNTER — Ambulatory Visit
Admission: RE | Admit: 2023-08-09 | Discharge: 2023-08-09 | Disposition: A | Discharge status: Home / Self Care | Source: Ambulatory Visit | Attending: Infectious Diseases | Admitting: Infectious Diseases

## 2023-08-09 DIAGNOSIS — M542 Cervicalgia: Secondary | ICD-10-CM | POA: Diagnosis present

## 2023-08-09 MED ORDER — GADOBUTROL 1 MMOL/ML IV SOLN
10.0000 mL | Freq: Once | INTRAVENOUS | Status: AC | PRN
  Administered 2023-08-09: 10 mL via INTRAVENOUS

## 2023-08-10 ENCOUNTER — Ambulatory Visit
Admission: RE | Admit: 2023-08-10 | Discharge: 2023-08-10 | Disposition: A | Discharge status: Home / Self Care | Source: Ambulatory Visit | Attending: Infectious Diseases | Admitting: Infectious Diseases

## 2023-08-10 DIAGNOSIS — Z1231 Encounter for screening mammogram for malignant neoplasm of breast: Secondary | ICD-10-CM | POA: Insufficient documentation
